# Patient Record
Sex: Male | Born: 1970 | Race: White | Hispanic: No | Marital: Single | Smoking: Former smoker
Health system: Southern US, Community
[De-identification: ages and names within clinical notes are randomized; demographics above are authoritative.]

## PROBLEM LIST (undated history)

## (undated) DIAGNOSIS — G894 Chronic pain syndrome: Secondary | ICD-10-CM

## (undated) DIAGNOSIS — J309 Allergic rhinitis, unspecified: Secondary | ICD-10-CM

## (undated) DIAGNOSIS — G8929 Other chronic pain: Secondary | ICD-10-CM

## (undated) DIAGNOSIS — Z720 Tobacco use: Secondary | ICD-10-CM

## (undated) DIAGNOSIS — R350 Frequency of micturition: Secondary | ICD-10-CM

## (undated) DIAGNOSIS — H1045 Other chronic allergic conjunctivitis: Secondary | ICD-10-CM

## (undated) DIAGNOSIS — R7301 Impaired fasting glucose: Secondary | ICD-10-CM

## (undated) DIAGNOSIS — J4520 Mild intermittent asthma, uncomplicated: Secondary | ICD-10-CM

## (undated) HISTORY — DX: Mild intermittent asthma, uncomplicated: J45.20

## (undated) HISTORY — DX: Frequency of micturition: R35.0

## (undated) HISTORY — PX: ANKLE FRACTURE SURGERY: SHX122

## (undated) HISTORY — PX: WISDOM TOOTH EXTRACTION: SHX21

## (undated) HISTORY — DX: Tobacco use: Z72.0

## (undated) HISTORY — PX: HYPOSPADIAS CORRECTION: SHX483

## (undated) HISTORY — DX: Chronic pain syndrome: G89.4

## (undated) HISTORY — DX: Allergic rhinitis, unspecified: J30.9

## (undated) HISTORY — DX: Impaired fasting glucose: R73.01

## (undated) HISTORY — DX: Other chronic allergic conjunctivitis: H10.45

---

## 2014-04-04 ENCOUNTER — Ambulatory Visit (INDEPENDENT_AMBULATORY_CARE_PROVIDER_SITE_OTHER): Payer: BC Managed Care – PPO | Admitting: Family Medicine

## 2014-04-04 ENCOUNTER — Encounter: Payer: Self-pay | Admitting: Family Medicine

## 2014-04-04 VITALS — BP 127/88 | HR 81 | Temp 98.1°F | Resp 18 | Ht 70.0 in | Wt 180.0 lb

## 2014-04-04 DIAGNOSIS — N411 Chronic prostatitis: Secondary | ICD-10-CM

## 2014-04-04 DIAGNOSIS — Z0000 Encounter for general adult medical examination without abnormal findings: Secondary | ICD-10-CM

## 2014-04-04 DIAGNOSIS — R350 Frequency of micturition: Secondary | ICD-10-CM

## 2014-04-04 LAB — CBC WITH DIFFERENTIAL/PLATELET
Basophils Absolute: 0 10*3/uL (ref 0.0–0.1)
Basophils Relative: 0.4 % (ref 0.0–3.0)
EOS PCT: 0.9 % (ref 0.0–5.0)
Eosinophils Absolute: 0.1 10*3/uL (ref 0.0–0.7)
HCT: 45.7 % (ref 39.0–52.0)
Hemoglobin: 15.2 g/dL (ref 13.0–17.0)
LYMPHS ABS: 2.3 10*3/uL (ref 0.7–4.0)
Lymphocytes Relative: 26.4 % (ref 12.0–46.0)
MCHC: 33.2 g/dL (ref 30.0–36.0)
MCV: 86.7 fl (ref 78.0–100.0)
MONOS PCT: 7.4 % (ref 3.0–12.0)
Monocytes Absolute: 0.6 10*3/uL (ref 0.1–1.0)
NEUTROS PCT: 64.9 % (ref 43.0–77.0)
Neutro Abs: 5.7 10*3/uL (ref 1.4–7.7)
Platelets: 293 10*3/uL (ref 150.0–400.0)
RBC: 5.27 Mil/uL (ref 4.22–5.81)
RDW: 13.4 % (ref 11.5–15.5)
WBC: 8.7 10*3/uL (ref 4.0–10.5)

## 2014-04-04 LAB — POCT URINALYSIS DIPSTICK
Bilirubin, UA: NEGATIVE
Blood, UA: NEGATIVE
Glucose, UA: NEGATIVE
Ketones, UA: NEGATIVE
LEUKOCYTES UA: NEGATIVE
NITRITE UA: NEGATIVE
PH UA: 5
PROTEIN UA: NEGATIVE
Spec Grav, UA: 1.025
UROBILINOGEN UA: 0.2

## 2014-04-04 MED ORDER — CIPROFLOXACIN HCL 500 MG PO TABS: 500.0000 mg | ORAL_TABLET | Freq: Two times a day (BID) | ORAL | Status: DC

## 2014-04-04 NOTE — Progress Notes (Signed)
Pre visit review using our clinic review tool, if applicable. No additional management support is needed unless otherwise documented below in the visit note. 

## 2014-04-04 NOTE — Progress Notes (Addendum)
Office Note 04/04/2014  CC:  Chief Complaint  Patient presents with  . Establish Care  . Urinary Frequency   HPI:  Sean Hale is a 43 y.o. White male who is here to establish care. Patient's most recent primary MD: none Old records were reviewed prior to or during today's visit.  Says he wants to get in better shape and live healthier so wants to get established with PCP, which he has never really had.  [place]    Hx of increased urinary frequency > 10 yrs.  Some increase in dribbling after emptying bladder lately--about once a week.  No dysuria.  Says he pees a lot each time. No nocturia.  No trouble with stream.  Empties bladder well.  Denies hx of prostate discomfort, no hx of prostatitis per his recall.  He says his frequency is even worse if he drinks only water instead of caffeinated drinks.  Says he has never had STD.  Says tested neg in past after a girlfriend cheated on him.  Past Medical History  Diagnosis Date  . Mild intermittent asthma   . Urinary frequency     Past Surgical History  Procedure Laterality Date  . Hypospadias correction    . Wisdom tooth extraction    . Ankle fracture surgery      Right ankle surgery (pins in) 2007    Family History  Problem Relation Age of Onset  . Cancer Mother   . Diabetes Father   . Graves' disease Sister   . Diabetes Brother   . Cancer Maternal Uncle   . Cancer Maternal Grandmother     History   Social History  . Marital Status: Single    Spouse Name: N/A    Number of Children: N/A  . Years of Education: N/A   Occupational History  . Not on file.   Social History Main Topics  . Smoking status: Former Smoker -- 2.00 packs/day for 22 years    Types: Cigarettes    Quit date: 09/02/2012  . Smokeless tobacco: Never Used  . Alcohol Use: No  . Drug Use: No  . Sexual Activity: Not on file   Other Topics Concern  . Not on file   Social History Narrative   Single, no children.    Orig from [place].   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   Tob: 40 pack-yr hx.  Alcohol: none currently.  Drugs: none.          Outpatient Encounter Prescriptions as of 04/04/2014  Medication Sig  . ciprofloxacin (CIPRO) 500 MG tablet Take 1 tablet (500 mg total) by mouth 2 (two) times daily.  Marland Kitchen. dextrose 5 % SOLN 1,000 mL with MVI-12 INJ 10 mL Inject 10 mLs into the vein daily.  . Omega-3 Fatty Acids (FISH OIL) 1000 MG CAPS Take by mouth.    Allergies  Allergen Reactions  . Anectine [Succinylcholine Chloride]     Stops heart.  . Codeine     Insomnia     ROS Review of Systems  Constitutional: Negative for fever and fatigue.  HENT: Negative for congestion and sore throat.   Eyes: Negative for visual disturbance.  Respiratory: Negative for cough.   Cardiovascular: Negative for chest pain.  Gastrointestinal: Negative for nausea and abdominal pain.  Genitourinary: Negative for dysuria.  Musculoskeletal: Negative for back pain and joint swelling.  Skin: Negative for rash.  Neurological: Negative for weakness and headaches.  Hematological: Negative for adenopathy.  PE; Blood pressure 127/88, pulse 81, temperature 98.1 F (36.7 C), temperature source Temporal, resp. rate 18, height 5\' 10"  (1.778 m), weight 180 lb (81.647 kg), SpO2 99.00%. Gen: Alert, well appearing.  Patient is oriented to person, place, time, and situation. AFFECT: pleasant, lucid thought and speech. ZOX:WRUEENT:Eyes: no injection, icteris, swelling, or exudate.  EOMI, PERRLA. Mouth: lips without lesion/swelling.  Oral mucosa pink and moist. Oropharynx without erythema, exudate, or swelling. 2 Neck - No masses or thyromegaly or limitation in range of motion CV: RRR, no m/r/g.   LUNGS: CTA bilat, nonlabored resps, good aeration in all lung fields. EXT: no clubbing, cyanosis, or edema.  Rectal exam: negative without mass, lesions or tenderness,  PROSTATE EXAM: smooth and symmetric without nodules,  enlarged 2+, tenderness noted diffusely, + urge to urinate noted with palpation.  Pertinent labs:  CC UA today was completely normal.  ASSESSMENT AND PLAN:   New pt: no old records to obtain.  Chronic prostatitis Ciprofloxacin 500 mg bid x 3 wks, may extend 2 additional weeks if incomplete improvement felt. If not improved at all, will ask urology to see. Will recheck in 3 wks and do CPE at that time.  Today, since he is fasting, will check CMET, CBC, FLP, TSH in prep for CPE at next f/u.  Return in about 3 weeks (around 04/25/2014) for 30 min for CPE..Marland Kitchen

## 2014-04-04 NOTE — Assessment & Plan Note (Addendum)
Ciprofloxacin 500 mg bid x 3 wks, may extend 2 additional weeks if incomplete improvement felt. If not improved at all, will ask urology to see. Will recheck in 3 wks and do CPE at that time.

## 2014-04-04 NOTE — Addendum Note (Signed)
Addended by: Jeoffrey MassedMCGOWEN, Nikos Anglemyer H on: 04/04/2014 11:39 AM   Modules accepted: Orders

## 2014-04-05 LAB — TSH: TSH: 0.98 u[IU]/mL (ref 0.35–4.50)

## 2014-04-05 LAB — COMPREHENSIVE METABOLIC PANEL
ALT: 15 U/L (ref 0–53)
AST: 15 U/L (ref 0–37)
Albumin: 3.6 g/dL (ref 3.5–5.2)
Alkaline Phosphatase: 84 U/L (ref 39–117)
BILIRUBIN TOTAL: 0.7 mg/dL (ref 0.2–1.2)
BUN: 16 mg/dL (ref 6–23)
CALCIUM: 9.6 mg/dL (ref 8.4–10.5)
CHLORIDE: 101 meq/L (ref 96–112)
CO2: 28 mEq/L (ref 19–32)
CREATININE: 1 mg/dL (ref 0.4–1.5)
GFR: 88.43 mL/min (ref >60.00)
Glucose, Bld: 101 mg/dL — ABNORMAL HIGH (ref 70–99)
Potassium: 4.7 mEq/L (ref 3.5–5.1)
SODIUM: 139 meq/L (ref 135–145)
TOTAL PROTEIN: 7.9 g/dL (ref 6.0–8.3)

## 2014-04-05 LAB — LIPID PANEL
CHOLESTEROL: 181 mg/dL (ref 0–200)
HDL: 42.5 mg/dL (ref >39.00)
LDL Cholesterol: 113 mg/dL — ABNORMAL HIGH (ref 0–99)
NONHDL: 138.5
Total CHOL/HDL Ratio: 4
Triglycerides: 130 mg/dL (ref 0.0–149.0)
VLDL: 26 mg/dL (ref 0.0–40.0)

## 2014-04-26 ENCOUNTER — Encounter: Payer: Self-pay | Admitting: Family Medicine

## 2014-04-26 ENCOUNTER — Ambulatory Visit (INDEPENDENT_AMBULATORY_CARE_PROVIDER_SITE_OTHER): Payer: BC Managed Care – PPO | Admitting: Family Medicine

## 2014-04-26 VITALS — BP 127/89 | HR 76 | Temp 98.9°F | Resp 18 | Ht 70.0 in | Wt 190.0 lb

## 2014-04-26 DIAGNOSIS — H9190 Unspecified hearing loss, unspecified ear: Secondary | ICD-10-CM | POA: Insufficient documentation

## 2014-04-26 DIAGNOSIS — M25571 Pain in right ankle and joints of right foot: Secondary | ICD-10-CM

## 2014-04-26 DIAGNOSIS — H9192 Unspecified hearing loss, left ear: Secondary | ICD-10-CM

## 2014-04-26 DIAGNOSIS — N411 Chronic prostatitis: Secondary | ICD-10-CM

## 2014-04-26 DIAGNOSIS — Z0000 Encounter for general adult medical examination without abnormal findings: Secondary | ICD-10-CM | POA: Insufficient documentation

## 2014-04-26 DIAGNOSIS — Z91030 Bee allergy status: Secondary | ICD-10-CM

## 2014-04-26 DIAGNOSIS — G8929 Other chronic pain: Secondary | ICD-10-CM | POA: Insufficient documentation

## 2014-04-26 DIAGNOSIS — Z91013 Allergy to seafood: Secondary | ICD-10-CM

## 2014-04-26 MED ORDER — CIPROFLOXACIN HCL 500 MG PO TABS: 500.0000 mg | ORAL_TABLET | Freq: Two times a day (BID) | ORAL | Status: DC

## 2014-04-26 MED ORDER — HYDROCODONE-ACETAMINOPHEN 7.5-300 MG PO TABS: ORAL_TABLET | ORAL | Status: DC

## 2014-04-26 NOTE — Assessment & Plan Note (Signed)
Improved with 3 wks of abx. Will give 3 more weeks of cipro 500 mg bid and see him back for recheck. If not completely normal urinary habits at that time then he wishes to see a urologist.

## 2014-04-26 NOTE — Progress Notes (Signed)
Pre visit review using our clinic review tool, if applicable. No additional management support is needed unless otherwise documented below in the visit note. 

## 2014-04-26 NOTE — Assessment & Plan Note (Signed)
Subjective. [facility] after right sided cerumen impaction removed today. We did not test hearing today, chose to wait and see how he feels over the next few weeks--recheck in office 3 wks.

## 2014-04-26 NOTE — Progress Notes (Signed)
Office Note 04/26/2014  CC:  Chief Complaint  Patient presents with  . Annual Exam   HPI:  Sean Hale is a 43 y.o. White male who is here for CPE and f/u recent recurrent/chronic prostatitis. Rx'd cipro x 3 wks.  He feels about 50 % improved regarding his urinary frequency and urgency. Has hx of pain in winter in right ankle when he works out--occ takes a vicodin in past (approx 2 per month) since hx of fracture/surgery on that ankle.     Past Medical History  Diagnosis Date  . Mild intermittent asthma   . Urinary frequency   . Health maintenance examination 04/26/2014    Past Surgical History  Procedure Laterality Date  . Hypospadias correction    . Wisdom tooth extraction    . Ankle fracture surgery      Right ankle surgery (pins in) 2007    Family History  Problem Relation Age of Onset  . Cancer Mother   . Diabetes Father   . Graves' disease Sister   . Diabetes Brother   . Cancer Maternal Uncle   . Cancer Maternal Grandmother     History   Social History  . Marital Status: Single    Spouse Name: N/A    Number of Children: N/A  . Years of Education: N/A   Occupational History  . Not on file.   Social History Main Topics  . Smoking status: Former Smoker -- 2.00 packs/day for 22 years    Types: Cigarettes    Quit date: 09/02/2012  . Smokeless tobacco: Never Used  . Alcohol Use: No  . Drug Use: No  . Sexual Activity: Not on file   Other Topics Concern  . Not on file   Social History Narrative   Single, no children.   Orig from [place].   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   Tob: 40 pack-yr hx.  Alcohol: none currently.  Drugs: none.          Outpatient Prescriptions Prior to Visit  Medication Sig Dispense Refill  . Omega-3 Fatty Acids (FISH OIL) 1000 MG CAPS Take by mouth.    . ciprofloxacin (CIPRO) 500 MG tablet Take 1 tablet (500 mg total) by mouth 2 (two) times daily. 42 tablet 0  . dextrose 5 % SOLN 1,000 mL with MVI-12 INJ  10 mL Inject 10 mLs into the vein daily.     No facility-administered medications prior to visit.    Allergies  Allergen Reactions  . Anectine [Succinylcholine Chloride]     Stops heart.  . Codeine     Insomnia     ROS Review of Systems  Constitutional: Negative for fever, chills, appetite change and fatigue.  HENT: Positive for hearing loss ("can't hear as well in left ear for about the last 9-10 mo, occ centrally located humming noise"). Negative for congestion, dental problem, ear pain and sore throat.   Eyes: Negative for discharge, redness and visual disturbance.  Respiratory: Negative for cough, chest tightness, shortness of breath and wheezing.   Cardiovascular: Negative for chest pain, palpitations and leg swelling.  Gastrointestinal: Negative for nausea, vomiting, abdominal pain, diarrhea and blood in stool.  Genitourinary: Negative for dysuria, urgency, frequency, hematuria, flank pain and difficulty urinating.  Musculoskeletal: Negative for myalgias, back pain, joint swelling, arthralgias and neck stiffness.  Skin: Negative for pallor and rash.  Neurological: Negative for dizziness, speech difficulty, weakness and headaches.  Hematological: Negative for adenopathy. Does not bruise/bleed easily.  Psychiatric/Behavioral:  Negative for confusion and sleep disturbance. The patient is not nervous/anxious.     PE; Blood pressure 127/89, pulse 76, temperature 98.9 F (37.2 C), temperature source Temporal, resp. rate 18, height 5\' 10"  (1.778 m), weight 190 lb (86.183 kg), SpO2 98 %. Gen: Alert, well appearing.  Patient is oriented to person, place, time, and situation. AFFECT: pleasant, lucid thought and speech. ENT: Ears: EACs: left clear, normal epithelium.  TM with good light reflex and landmarks bilaterally.  Right EAC had 80% cerumen impaction and when I removed this with my curette he had resolution of his hearing complaint on the OPPOSITE side.   Eyes: no injection,  icteris, swelling, or exudate.  EOMI, PERRLA. Nose: no drainage or turbinate edema/swelling.  No injection or focal lesion.  Mouth: lips without lesion/swelling.  Oral mucosa pink and moist.  Dentition intact and without obvious caries or gingival swelling.  Oropharynx without erythema, exudate, or swelling.  Neck: supple/nontender.  No LAD, mass, or TM.  Carotid pulses 2+ bilaterally, without bruits. CV: RRR, no m/r/g.   LUNGS: CTA bilat, nonlabored resps, good aeration in all lung fields. ABD: soft, NT, ND, BS normal.  No hepatospenomegaly or mass.  No bruits. EXT: no clubbing, cyanosis, or edema.  Musculoskeletal: no joint swelling, erythema, warmth, or tenderness.  ROM of all joints intact. Skin - no sores or suspicious lesions or rashes or color changes Rectal exam: negative without mass, lesions or tenderness, PROSTATE EXAM: smooth and symmetric without nodules but very mildly tender diffusely and very mild urge to urinate with palpation.  Pertinent labs:  Lab Results  Component Value Date   TSH 0.98 04/04/2014   Lab Results  Component Value Date   WBC 8.7 04/04/2014   HGB 15.2 04/04/2014   HCT 45.7 04/04/2014   MCV 86.7 04/04/2014   PLT 293.0 04/04/2014   Lab Results  Component Value Date   CREATININE 1.0 04/04/2014   BUN 16 04/04/2014   NA 139 04/04/2014   K 4.7 04/04/2014   CL 101 04/04/2014   CO2 28 04/04/2014   Lab Results  Component Value Date   ALT 15 04/04/2014   AST 15 04/04/2014   ALKPHOS 84 04/04/2014   BILITOT 0.7 04/04/2014   Lab Results  Component Value Date   CHOL 181 04/04/2014   Lab Results  Component Value Date   HDL 42.50 04/04/2014   Lab Results  Component Value Date   LDLCALC 113* 04/04/2014   Lab Results  Component Value Date   TRIG 130.0 04/04/2014   Lab Results  Component Value Date   CHOLHDL 4 04/04/2014   No results found for: PSA   ASSESSMENT AND PLAN:   Health maintenance examination Reviewed age and gender  appropriate health maintenance issues (prudent diet, regular exercise, health risks of tobacco and excessive alcohol, use of seatbelts, fire alarms in home, use of sunscreen).  Also reviewed age and gender appropriate health screening as well as vaccine recommendations. HP labs normal, reviewed with pt. He has hx of allergy to bee sting as child and to shellfish; he requests repeat allergy testing so I have ordered referral to allergiest today.   Chronic prostatitis Improved with 3 wks of abx. Will give 3 more weeks of cipro 500 mg bid and see him back for recheck. If not completely normal urinary habits at that time then he wishes to see a urologist.  Hearing impairment Subjective. He seemed to feel [facility] improved after right sided cerumen impaction removed today. We  did not test hearing today, chose to wait and see how he feels over the next few weeks--recheck in office 3 wks.  Chronic pain of right ankle Hurts only in winter time with/after working out. Says he has hx of using approx 2 vicodin 7.5mg  tabs per month, so I agreed to rx #30 of these today and this should last him until summer 2016 or so.  An After Visit Summary was printed and given to the patient.  FOLLOW UP:  Return in about 3 weeks (around 05/17/2014) for f/u prostatitis and hearing complaint.

## 2014-04-26 NOTE — Assessment & Plan Note (Signed)
Reviewed age and gender appropriate health maintenance issues (prudent diet, regular exercise, health risks of tobacco and excessive alcohol, use of seatbelts, fire alarms in home, use of sunscreen).  Also reviewed age and gender appropriate health screening as well as vaccine recommendations. HP labs normal, reviewed with pt. He has hx of allergy to bee sting as child and to shellfish; he requests repeat allergy testing so I have ordered referral to allergiest today.

## 2014-04-26 NOTE — Assessment & Plan Note (Signed)
Hurts only in winter time with/after working out. Says he has hx of using approx 2 vicodin 7.5mg  tabs per month, so I agreed to rx #30 of these today and this should last him until summer 2016 or so.

## 2014-05-22 ENCOUNTER — Encounter: Payer: Self-pay | Admitting: Family Medicine

## 2014-05-23 ENCOUNTER — Ambulatory Visit: Payer: BC Managed Care – PPO | Admitting: Family Medicine

## 2014-09-24 ENCOUNTER — Ambulatory Visit (INDEPENDENT_AMBULATORY_CARE_PROVIDER_SITE_OTHER): Payer: BLUE CROSS/BLUE SHIELD | Admitting: Family Medicine

## 2014-09-24 ENCOUNTER — Encounter: Payer: Self-pay | Admitting: *Deleted

## 2014-09-24 ENCOUNTER — Encounter: Payer: Self-pay | Admitting: Family Medicine

## 2014-09-24 VITALS — BP 118/78 | HR 78 | Temp 97.7°F | Ht 70.0 in | Wt 194.0 lb

## 2014-09-24 DIAGNOSIS — R509 Fever, unspecified: Secondary | ICD-10-CM | POA: Diagnosis not present

## 2014-09-24 DIAGNOSIS — M25571 Pain in right ankle and joints of right foot: Secondary | ICD-10-CM | POA: Diagnosis not present

## 2014-09-24 DIAGNOSIS — G8929 Other chronic pain: Secondary | ICD-10-CM

## 2014-09-24 DIAGNOSIS — R1033 Periumbilical pain: Secondary | ICD-10-CM

## 2014-09-24 DIAGNOSIS — R109 Unspecified abdominal pain: Secondary | ICD-10-CM | POA: Insufficient documentation

## 2014-09-24 MED ORDER — HYDROCODONE-ACETAMINOPHEN 7.5-300 MG PO TABS: ORAL_TABLET | ORAL | Status: DC

## 2014-09-24 NOTE — Progress Notes (Signed)
Pre visit review using our clinic review tool, if applicable. No additional management support is needed unless otherwise documented below in the visit note. 

## 2014-09-24 NOTE — Progress Notes (Signed)
OFFICE NOTE  09/24/2014  CC:  Chief Complaint  Patient presents with  . Follow-up    medication refills    HPI: Patient is a 44 y.o. Caucasian male who is here for f/u, needs rf of his vicodin 7.5/300 tabs that he takes occasionally for intermittent R ankle pain since surgery 2007.  Also, describes a recent 4d period of abdominal pain with fever, no n/v.  Says his throat was itchy and hurt some.  No diarrhea or constipation.  No cough.  Missed 4 days of work and asks for work excuse. Says it may have had something to do with some pizza he ate.  No urinary urgency, frequency, hematuria, or prostate discomfort.  No melena or hematochezia.  No rash.  No recent travel or new meds. Says all sx's have been resolved for last 1-2 d now.  Also got some teeth pulled recently and had to use some of his vicodin for this.   Has not had any significant issues with his prostate since I saw him last.  Pertinent PMH:  Past medical, surgical, social, and family history reviewed and no changes are noted since last office visit.  MEDS: Not taking cipro as listed below Outpatient Prescriptions Prior to Visit  Medication Sig Dispense Refill  . Hydrocodone-Acetaminophen 7.5-300 MG TABS 1-2 tabs q6h prn right ankle pain 30 each 0  . Multiple Vitamin (MULTIVITAMIN) tablet Take 1 tablet by mouth daily.    . Omega-3 Fatty Acids (FISH OIL) 1000 MG CAPS Take by mouth.    . ciprofloxacin (CIPRO) 500 MG tablet Take 1 tablet (500 mg total) by mouth 2 (two) times daily. (Patient not taking: Reported on 09/24/2014) 42 tablet 0   No facility-administered medications prior to visit.    PE: Blood pressure 118/78, pulse 78, temperature 97.7 F (36.5 C), temperature source Oral, height 5\' 10"  (1.778 m), weight 194 lb (87.998 kg), SpO2 97 %. Gen: Alert, well appearing.  Patient is oriented to person, place, time, and situation. CV: RRR, no m/r/g.   LUNGS: CTA bilat, nonlabored resps, good aeration in all lung  fields. ABD: mild discomfort with palpation over umbillical region EXT: no clubbing, cyanosis, or edema.  ROM of R ankle intact.  Mild discomfort with ROM but nontender and no swelling or erythema.   IMPRESSION AND PLAN:  1) Chronic, intermittent R ankle pain.  Uses vicodin 7.5/300 for this occasionally. Printed new rx for this today.  Therapeutic expectations and side effect profile of medication discussed today.  Patient's questions answered.  2) Recent febrile illness with abd pain: seems to have resolved spontaneously.   Question of food poisoning but seems a bit lengthy for this type of illness. At any rate, he appears to be well now.  Work excuse written f7or the 4 days he missed due to this illness.  An After Visit Summary was printed and given to the patient.  FOLLOW UP:  7-8 mo for fasting CPE

## 2015-01-04 ENCOUNTER — Other Ambulatory Visit: Payer: Self-pay | Admitting: *Deleted

## 2015-01-04 MED ORDER — HYDROCODONE-ACETAMINOPHEN 7.5-300 MG PO TABS: ORAL_TABLET | ORAL | Status: DC

## 2015-01-04 NOTE — Telephone Encounter (Signed)
Pt LMOM RF request for hydro/APAP LOV: 09/24/14 Next ov: None Last written: 09/24/14 #30 w/ [id number]

## 2015-03-22 ENCOUNTER — Encounter: Payer: Self-pay | Admitting: Family Medicine

## 2015-03-22 ENCOUNTER — Ambulatory Visit (INDEPENDENT_AMBULATORY_CARE_PROVIDER_SITE_OTHER): Payer: BLUE CROSS/BLUE SHIELD | Admitting: Family Medicine

## 2015-03-22 VITALS — BP 116/85 | HR 80 | Temp 98.5°F | Resp 16 | Ht 70.0 in | Wt 193.0 lb

## 2015-03-22 DIAGNOSIS — S29011A Strain of muscle and tendon of front wall of thorax, initial encounter: Secondary | ICD-10-CM

## 2015-03-22 DIAGNOSIS — S2341XA Sprain of ribs, initial encounter: Secondary | ICD-10-CM | POA: Diagnosis not present

## 2015-03-22 MED ORDER — HYDROCODONE-ACETAMINOPHEN 7.5-300 MG PO TABS: ORAL_TABLET | ORAL | Status: DC

## 2015-03-22 NOTE — Progress Notes (Signed)
Pre visit review using our clinic review tool, if applicable. No additional management support is needed unless otherwise documented below in the visit note. 

## 2015-03-22 NOTE — Patient Instructions (Signed)
Take 600 mg generic OTC ibuprofen twice a day WITH FOOD for 10 days, then stop.

## 2015-03-22 NOTE — Progress Notes (Signed)
OFFICE NOTE  03/22/2015  CC:  Chief Complaint  Patient presents with  . Chest Pain    ribs on left side x 1 week    HPI: Patient is a 44 y.o. Caucasian male who is here for sharp, left sided chest pain, onset about 5 days ago. Progressively getting worse.  Only hurts when he takes a deep breath, laughs, or moves trunk certain ways.  Has started working out lately again, also moved furniture relatively recently.  No SOB, no cough, no fever.  Took ibup 2 tabs x 2 doses--no help so he stopped it.  Also tried vicodin.  Pertinent PMH:  Past Medical History  Diagnosis Date  . Mild intermittent asthma   . Urinary frequency   . Health maintenance examination 04/26/2014  . Allergic rhinitis     pollen (+other?)  . Chronic allergic conjunctivitis    Past Surgical History  Procedure Laterality Date  . Hypospadias correction    . Wisdom tooth extraction    . Ankle fracture surgery      Right ankle surgery (pins in) 2007    MEDS:  Outpatient Prescriptions Prior to Visit  Medication Sig Dispense Refill  . Hydrocodone-Acetaminophen 7.5-300 MG TABS 1-2 tabs q6h prn right ankle pain 30 each 0  . Multiple Vitamin (MULTIVITAMIN) tablet Take 1 tablet by mouth daily.    . Omega-3 Fatty Acids (FISH OIL) 1000 MG CAPS Take by mouth.     No facility-administered medications prior to visit.    PE: Blood pressure 116/85, pulse 80, temperature 98.5 F (36.9 C), temperature source Oral, resp. rate 16, height  (1.778 m), weight 193 lb (87.544 kg), SpO2 98 %. Gen: Alert, well appearing.  Patient is oriented to person, place, time, and situation. ZOX:WRUE: no injection, icteris, swelling, or exudate.  EOMI, PERRLA. Mouth: lips without lesion/swelling.  Oral mucosa pink and moist. Oropharynx without erythema, exudate, or swelling.  Neck - No masses or thyromegaly or limitation in range of motion CV: RRR, no m/r/g.   LUNGS: CTA bilat, nonlabored resps, good aeration in all lung fields. Chest  wall: no sternal or costochondral TTP.  He has focal intercostal TTP in region just below L nipple (in 2 rib spaces).  No rib tenderness or deformity.  No rash or bruising.  IMPRESSION AND PLAN:  Intercostal muscle strain: ibuprofen 600 mg bid with food x 10d. Ice or heat--whichever he finds most helpful for comfort. Rest/avoid lifting, twisting, bending, or other offending movements the best he can over the next 7-10 days.  I RF'd his vicodin rx today: #30.  An After Visit Summary was printed and given to the patient.   FOLLOW UP: prn

## 2015-05-13 ENCOUNTER — Other Ambulatory Visit: Payer: Self-pay | Admitting: *Deleted

## 2015-05-13 NOTE — Telephone Encounter (Signed)
RF request for hydrocodone LOV: 03/22/15 Next ov: None Last written: 03/22/15 #30 w/ 0RF  Please advise. Thanks.

## 2015-05-15 MED ORDER — HYDROCODONE-ACETAMINOPHEN 7.5-300 MG PO TABS: ORAL_TABLET | ORAL | Status: DC

## 2015-05-15 NOTE — Telephone Encounter (Signed)
Left message stating Rx is ready for p/u.  

## 2015-05-15 NOTE — Telephone Encounter (Signed)
I have printed a rx for vicodin #30 but notify pt that he has to be seen in office for f/u of his pain prior to any FURTHER Rx's for this med.-thx

## 2015-07-05 ENCOUNTER — Other Ambulatory Visit: Payer: Self-pay | Admitting: *Deleted

## 2015-07-05 NOTE — Telephone Encounter (Signed)
Pt LMOM on 07/05/15 at 9:46am requesting refill.   RF request for hydrocodone/apap LOV: 03/22/15 Next ov: None Last written: 05/15/15 #30 w/ 0RF  Please advise. Thanks.

## 2015-07-08 MED ORDER — HYDROCODONE-ACETAMINOPHEN 7.5-300 MG PO TABS: ORAL_TABLET | ORAL | Status: DC

## 2015-07-08 NOTE — Telephone Encounter (Signed)
Will print rx but pt needs office f/u: either med check 15 min or CPE 30 min--prior to any FURTHER RF's of this med.-thx

## 2015-07-10 NOTE — Telephone Encounter (Signed)
Left message for pt to call back  °

## 2015-07-12 NOTE — Telephone Encounter (Addendum)
Left message for pt to call back. Pt has already p/u Rx.

## 2015-07-15 NOTE — Telephone Encounter (Signed)
Left detailed message on cell vm, okay per DPR.  

## 2015-07-16 ENCOUNTER — Encounter: Payer: Self-pay | Admitting: Family Medicine

## 2015-07-16 ENCOUNTER — Encounter: Payer: Self-pay | Admitting: *Deleted

## 2015-07-16 ENCOUNTER — Ambulatory Visit (INDEPENDENT_AMBULATORY_CARE_PROVIDER_SITE_OTHER): Payer: Managed Care, Other (non HMO) | Admitting: Family Medicine

## 2015-07-16 VITALS — BP 119/85 | HR 74 | Temp 97.9°F | Resp 16 | Ht 70.0 in | Wt 195.5 lb

## 2015-07-16 DIAGNOSIS — J111 Influenza due to unidentified influenza virus with other respiratory manifestations: Secondary | ICD-10-CM | POA: Diagnosis not present

## 2015-07-16 DIAGNOSIS — J018 Other acute sinusitis: Secondary | ICD-10-CM

## 2015-07-16 DIAGNOSIS — R69 Illness, unspecified: Secondary | ICD-10-CM

## 2015-07-16 MED ORDER — CLARITHROMYCIN 500 MG PO TABS: 500.0000 mg | ORAL_TABLET | Freq: Two times a day (BID) | ORAL | Status: DC

## 2015-07-16 NOTE — Progress Notes (Signed)
OFFICE VISIT  07/16/2015   CC:  Chief Complaint  Patient presents with  . Cough    productive with low grade fever and fatigue x 1 week    HPI:    Patient is a 45 y.o. Caucasian male who presents for almost 2 week history of respiratory illness. Started with ST, HA, subjective fevers--low grade, mild nausea, body achy all over.  He got up one morning and coughed up some phlegm but had no further symptoms like this.   No SOB.  Has felt very fatigued.  No rash.   Had been having nasal/sinus congestion/runny nose for 2 weeks PRIOR to onset of this illness. No face pain, but has been having L>R upper teeth pain.  +Facial pressure/peri-orbital HA.  Past Medical History  Diagnosis Date  . Mild intermittent asthma   . Urinary frequency   . Health maintenance examination 04/26/2014  . Allergic rhinitis     pollen (+other?)  . Chronic allergic conjunctivitis     Past Surgical History  Procedure Laterality Date  . Hypospadias correction    . Wisdom tooth extraction    . Ankle fracture surgery      Right ankle surgery (pins in) 2007    Outpatient Prescriptions Prior to Visit  Medication Sig Dispense Refill  . Hydrocodone-Acetaminophen 7.5-300 MG TABS 1-2 tabs q6h prn right ankle pain 30 each 0  . Multiple Vitamin (MULTIVITAMIN) tablet Take 1 tablet by mouth daily.    . Omega-3 Fatty Acids (FISH OIL) 1000 MG CAPS Take by mouth.     No facility-administered medications prior to visit.    Allergies  Allergen Reactions  . Anectine [Succinylcholine Chloride]     Stops heart.  . Codeine     Insomnia     ROS As per HPI  PE: Blood pressure 119/85, pulse 74, temperature 97.9 F (36.6 C), temperature source Oral, resp. rate 16, height  (1.778 m), weight 195 lb 8 oz (88.678 kg), SpO2 94 %. VS: noted--normal. Gen: alert, NAD, NONTOXIC APPEARING. HEENT: eyes without injection, drainage, or swelling.  Ears: EACs clear, TMs with normal light reflex and landmarks.  Nose: Clear  rhinorrhea, with some dried, crusty exudate adherent to mildly injected mucosa.  No purulent d/c.  No paranasal sinus TTP.  No facial swelling.  Throat and mouth without focal lesion.  No pharyngial swelling, erythema, or exudate.   Neck: supple, no LAD.   LUNGS: CTA bilat, nonlabored resps.   CV: RRR, no m/r/g. EXT: no c/c/e SKIN: no rash  LABS:  None today  IMPRESSION AND PLAN:  Recent influenza-like illness. I believe he has an acute sinus infection now. Clarithromycin 500 mg bid x 10d. He says nasal sprays give him a HA. Note for work written--he missed 1/15-1/19 (5 days), and he returned to work 07/14/15.  An After Visit Summary was printed and given to the patient.  FOLLOW UP: Return if symptoms worsen or fail to improve.

## 2015-07-16 NOTE — Progress Notes (Signed)
Pre visit review using our clinic review tool, if applicable. No additional management support is needed unless otherwise documented below in the visit note. 

## 2015-07-26 ENCOUNTER — Encounter: Payer: Self-pay | Admitting: Family Medicine

## 2015-07-26 ENCOUNTER — Ambulatory Visit (INDEPENDENT_AMBULATORY_CARE_PROVIDER_SITE_OTHER): Payer: Managed Care, Other (non HMO) | Admitting: Family Medicine

## 2015-07-26 VITALS — BP 125/88 | HR 80 | Temp 97.5°F | Resp 16 | Ht 70.0 in | Wt 199.0 lb

## 2015-07-26 DIAGNOSIS — M25571 Pain in right ankle and joints of right foot: Secondary | ICD-10-CM

## 2015-07-26 DIAGNOSIS — G894 Chronic pain syndrome: Secondary | ICD-10-CM | POA: Diagnosis not present

## 2015-07-26 MED ORDER — HYDROCODONE-ACETAMINOPHEN 7.5-300 MG PO TABS: ORAL_TABLET | ORAL | Status: DC

## 2015-07-26 NOTE — Progress Notes (Signed)
OFFICE VISIT  07/26/2015   CC:  Chief Complaint  Patient presents with  . Follow-up    Pain medication.     HPI:    Patient is a 45 y.o. Caucasian male who presents for f/u, needs RF of his vicodin 7.5/300 tabs that he takes occasionally for intermittent R ankle pain since surgery 2007.   Recently strained L thigh when bowling and his left foot slipped.   R ankle hurts intermittently, worse with cold weather and when he intermittently twists it--it is susceptible to twisting easily since having surgery.  He takes anywhere from 2-7 vicodin pills per week.  Most recent one he took was this morning.  He also wears soft R ankle support.   Past Medical History  Diagnosis Date  . Mild intermittent asthma   . Urinary frequency   . Health maintenance examination 04/26/2014  . Allergic rhinitis     pollen (+other?)  . Chronic allergic conjunctivitis   . Chronic pain syndrome     R ankle, intermittent pain since surgery 2007    Past Surgical History  Procedure Laterality Date  . Hypospadias correction    . Wisdom tooth extraction    . Ankle fracture surgery      Right ankle surgery (pins in) 2007    Outpatient Prescriptions Prior to Visit  Medication Sig Dispense Refill  . Multiple Vitamin (MULTIVITAMIN) tablet Take 1 tablet by mouth daily.    . Omega-3 Fatty Acids (FISH OIL) 1000 MG CAPS Take by mouth.    . Hydrocodone-Acetaminophen 7.5-300 MG TABS 1-2 tabs q6h prn right ankle pain 30 each 0  . clarithromycin (BIAXIN) 500 MG tablet Take 1 tablet (500 mg total) by mouth 2 (two) times daily. (Patient not taking: Reported on 07/26/2015) 20 tablet 0   No facility-administered medications prior to visit.    Allergies  Allergen Reactions  . Anectine [Succinylcholine Chloride]     Stops heart.  . Codeine     Insomnia     ROS As per HPI  PE: Blood pressure 125/88, pulse 80, temperature 97.5 F (36.4 C), temperature source Oral, resp. rate 16, height  (1.778 m), weight  199 lb (90.266 kg), SpO2 97 %. Gen: Alert, well appearing.  Patient is oriented to person, place, time, and situation. R ankle: no erythema, warmth, or swelling.  PT/DP pulses 2+. ROM intact but some pain with extreme of dorsiflexion.  No tenderness to palpation, no instability.  LABS:  None today  IMPRESSION AND PLAN:  Chronic pain syndrome: R ankle instability/recurrent sprains/arthritic pain--since injury and subsequent surgery 2007.  He uses small amounts of narcotic pain meds.  In the past, use of low/avg dose NSAIDs did nothing for his pain. UDS today (last took a vicodin this morning). I printed rx for vicodin 7.5/300, 1-2 tabs q6h prn, #30  today.    An After Visit Summary was printed and given to the patient.  FOLLOW UP: Return for 30 min CPE appt at pt's convenience.

## 2015-07-26 NOTE — Progress Notes (Signed)
Pre visit review using our clinic review tool, if applicable. No additional management support is needed unless otherwise documented below in the visit note. 

## 2015-08-09 ENCOUNTER — Encounter: Payer: Managed Care, Other (non HMO) | Admitting: Family Medicine

## 2015-08-28 ENCOUNTER — Other Ambulatory Visit: Payer: Self-pay

## 2015-08-28 MED ORDER — HYDROCODONE-ACETAMINOPHEN 7.5-300 MG PO TABS: ORAL_TABLET | ORAL | Status: DC

## 2015-08-28 NOTE — Telephone Encounter (Signed)
Patient requesting refill for: Hydrocodone-Acetaminophen 7.5-300 MG TABS  Last Refill: 07/26/2015 Last OV: 07/26/2015

## 2015-08-28 NOTE — Telephone Encounter (Signed)
This encounter was created in error - please disregard.

## 2015-08-29 ENCOUNTER — Telehealth: Payer: Self-pay

## 2015-08-29 NOTE — Telephone Encounter (Signed)
Called patient left detailed message informing Rx ready for pick up at front desk.

## 2015-09-15 ENCOUNTER — Encounter (HOSPITAL_COMMUNITY): Payer: Self-pay | Admitting: *Deleted

## 2015-09-15 ENCOUNTER — Emergency Department (HOSPITAL_COMMUNITY): Payer: Managed Care, Other (non HMO)

## 2015-09-15 ENCOUNTER — Emergency Department (HOSPITAL_COMMUNITY)
Admission: EM | Admit: 2015-09-15 | Discharge: 2015-09-15 | Disposition: A | Discharge status: Home / Self Care | Payer: Managed Care, Other (non HMO) | Attending: Emergency Medicine | Admitting: Emergency Medicine

## 2015-09-15 DIAGNOSIS — Y9301 Activity, walking, marching and hiking: Secondary | ICD-10-CM | POA: Diagnosis not present

## 2015-09-15 DIAGNOSIS — Z87891 Personal history of nicotine dependence: Secondary | ICD-10-CM | POA: Diagnosis not present

## 2015-09-15 DIAGNOSIS — S99912A Unspecified injury of left ankle, initial encounter: Secondary | ICD-10-CM | POA: Diagnosis not present

## 2015-09-15 DIAGNOSIS — Y92009 Unspecified place in unspecified non-institutional (private) residence as the place of occurrence of the external cause: Secondary | ICD-10-CM | POA: Insufficient documentation

## 2015-09-15 DIAGNOSIS — Y998 Other external cause status: Secondary | ICD-10-CM | POA: Diagnosis not present

## 2015-09-15 DIAGNOSIS — X58XXXA Exposure to other specified factors, initial encounter: Secondary | ICD-10-CM | POA: Insufficient documentation

## 2015-09-15 DIAGNOSIS — J4520 Mild intermittent asthma, uncomplicated: Secondary | ICD-10-CM | POA: Diagnosis not present

## 2015-09-15 DIAGNOSIS — G894 Chronic pain syndrome: Secondary | ICD-10-CM | POA: Insufficient documentation

## 2015-09-15 DIAGNOSIS — Z79899 Other long term (current) drug therapy: Secondary | ICD-10-CM | POA: Diagnosis not present

## 2015-09-15 DIAGNOSIS — M25572 Pain in left ankle and joints of left foot: Secondary | ICD-10-CM

## 2015-09-15 DIAGNOSIS — G8929 Other chronic pain: Secondary | ICD-10-CM | POA: Insufficient documentation

## 2015-09-15 HISTORY — DX: Other chronic pain: G89.29

## 2015-09-15 MED ORDER — KETOROLAC TROMETHAMINE 60 MG/2ML IM SOLN
60.0000 mg | Freq: Once | INTRAMUSCULAR | Status: AC
  Administered 2015-09-15: 60 mg via INTRAMUSCULAR
  Filled 2015-09-15: qty 2

## 2015-09-15 MED ORDER — NAPROXEN 500 MG PO TABS: 500.0000 mg | ORAL_TABLET | Freq: Two times a day (BID) | ORAL | Status: AC

## 2015-09-15 NOTE — ED Provider Notes (Signed)
CSN: [id number]     Arrival date & time 09/15/15  0704 History   First MD Initiated Contact with Patient 09/15/15 [phone]     Chief Complaint  Patient presents with  . Ankle Pain     (Consider location/radiation/quality/duration/timing/severity/associated sxs/prior Treatment) HPI   Sean Hale is a 45 y.o. male, with a history of chronic pain syndrome, presenting to the ED with left foot and ankle pain due to an injury that occurred a week ago. Patient rates his pain at 6 out of 10, throbbing, located laterally on the foot and ankle, nonradiating. Pt states he was walking at home and "rolled" his ankle. Patient states that he has 7.5-300mg  hydrocodone acetaminophen that he uses for chronic pain in his right ankle. Patient took a dose of this medication prior to arrival.     Past Medical History  Diagnosis Date  . Mild intermittent asthma   . Urinary frequency   . Health maintenance examination 04/26/2014  . Allergic rhinitis     pollen (+other?)  . Chronic allergic conjunctivitis   . Chronic pain syndrome     R ankle, intermittent pain since surgery 2007  . Chronic pain     PT reports he takes hydrocodone for chronic pain to RT ankle.   Past Surgical History  Procedure Laterality Date  . Hypospadias correction    . Wisdom tooth extraction    . Ankle fracture surgery      Right ankle surgery (pins in) 2007   Family History  Problem Relation Age of Onset  . Cancer Mother   . Diabetes Father   . Graves' disease Sister   . Diabetes Brother   . Cancer Maternal Uncle   . Cancer Maternal Grandmother    Social History  Substance Use Topics  . Smoking status: Former Smoker -- 2.00 packs/day for 22 years    Types: Cigarettes    Quit date: 09/02/2012  . Smokeless tobacco: Never Used  . Alcohol Use: No    Review of Systems  Musculoskeletal: Positive for arthralgias (left ankle).  Neurological: Negative for weakness and numbness.      Allergies  Anectine and  Codeine  Home Medications   Prior to Admission medications   Medication Sig Start Date End Date Taking? Authorizing Provider  Hydrocodone-Acetaminophen 7.5-300 MG TABS 1-2 tabs q6h prn right ankle pain 08/28/15   Jeoffrey MassedPhilip H McGowen, MD  Multiple Vitamin (MULTIVITAMIN) tablet Take 1 tablet by mouth daily.    Historical Provider, MD  naproxen (NAPROSYN) 500 MG tablet Take 1 tablet (500 mg total) by mouth 2 (two) times daily. 09/15/15   Lunabelle Oatley C Yatziri Wainwright, PA-C  Omega-3 Fatty Acids (FISH OIL) 1000 MG CAPS Take by mouth.    Historical Provider, MD   BP 141/97 mmHg  Pulse 96  Temp(Src) 98 F (36.7 C) (Oral)  Resp 18  Ht 5\' 11"  (1.803 m)  Wt 88.451 kg  BMI 27.21 kg/m2  SpO2 98% Physical Exam  Constitutional: He appears well-developed and well-nourished. No distress.  HENT:  Head: Normocephalic and atraumatic.  Eyes: Conjunctivae are normal.  Cardiovascular: Normal rate, regular rhythm and intact distal pulses.   Pulmonary/Chest: Effort normal.  Musculoskeletal:  Full ROM in left ankle and foot. Tenderness with slight swelling and contusion to the lateral left ankle and foot. Pt is able to bear weight on the left foot and ankle. CMS intact distal to the injury.  Neurological: He is alert.  No sensory deficits. Strength 5/5 in the left ankle and  foot.   Skin: Skin is warm and dry. He is not diaphoretic.  Nursing note and vitals reviewed.   ED Course  Procedures (including critical care time) Labs Review Labs Reviewed - No data to display  Imaging Review Dg Ankle Complete Left  09/15/2015  CLINICAL DATA:  LEFT ankle pain.  This began 1 week ago. EXAM: LEFT ANKLE COMPLETE - 3+ VIEW COMPARISON:  None. FINDINGS: There is no evidence of fracture, dislocation, or joint effusion. There is no evidence of arthropathy or other focal bone abnormality. Mild soft tissue swelling. IMPRESSION: Negative for fracture. Electronically Signed   By: Elsie Stain M.D.   On: 09/15/2015 08:41   Dg Foot Complete  Left  09/15/2015  CLINICAL DATA:  Pt reports he got his left foot caught in a blanket 1 week ago while he was trying to get up and has been having pain, swelling, and bruising on the lateral side of left foot and ankle since then; he has been unable to bear weight * EXAM: LEFT FOOT - COMPLETE 3+ VIEW COMPARISON:  None. FINDINGS: No fracture or dislocation of mid foot or forefoot. The phalanges are normal. The calcaneus is normal. No soft tissue abnormality. IMPRESSION: No acute osseous abnormality. Electronically Signed   By: Genevive Bi M.D.   On: 09/15/2015 08:39   I have personally reviewed and evaluated these images as part of my medical decision-making.   EKG Interpretation None      MDM   Final diagnoses:  Ankle pain, left    Sean Hale presents with left foot and ankle pain due to an injury that occurred 1 week ago.  Suspect a soft tissue injury. X-ray showed no acute abnormality. Patient to follow up with orthopedics should symptoms continue. ASO ankle brace placed. Home care and return precautions discussed. Patient voiced understanding of these instructions and is comfortable with discharge.  Filed Vitals:   09/15/15 0724 09/15/15 0857  BP: 147/92 141/97  Pulse: 95 96  Temp: 97.6 F (36.4 C) 98 F (36.7 C)  TempSrc: Oral Oral  Resp: 18 18  Height:  (1.803 m)   Weight: 88.451 kg   SpO2: 98% 98%       Anselm Pancoast, PA-C 09/15/15 0914  Laurence Spates, MD 09/15/15 1032

## 2015-09-15 NOTE — Discharge Instructions (Signed)
You have been seen today for ankle pain. Your imaging showed no abnormalities. Follow up with orthopedics for further evaluation and treatment of this issue. Follow up with orthopedics should symptoms continue. Follow up with PCP as needed. Return to ED should symptoms worsen.

## 2015-09-15 NOTE — ED Notes (Addendum)
PT reports he injured his LT foot and ankle last Sunday. Pt reports he lives on the 3rd floor apt. And due to pain in Lt ankle and foot can not go up stairs. Pt reports he takes  Hydrocodone for previous injury to RT ankle. Pt rates pain to LT ankle to be 6/10.

## 2015-09-16 ENCOUNTER — Encounter: Payer: Self-pay | Admitting: Family Medicine

## 2015-09-16 ENCOUNTER — Ambulatory Visit (INDEPENDENT_AMBULATORY_CARE_PROVIDER_SITE_OTHER): Payer: Managed Care, Other (non HMO) | Admitting: Family Medicine

## 2015-09-16 VITALS — BP 143/92 | HR 86 | Temp 97.7°F | Resp 16 | Ht 70.0 in | Wt 197.5 lb

## 2015-09-16 DIAGNOSIS — S93402D Sprain of unspecified ligament of left ankle, subsequent encounter: Secondary | ICD-10-CM | POA: Diagnosis not present

## 2015-09-16 MED ORDER — HYDROCODONE-ACETAMINOPHEN 7.5-300 MG PO TABS: ORAL_TABLET | ORAL | Status: DC

## 2015-09-16 NOTE — Patient Instructions (Signed)

## 2015-09-16 NOTE — Progress Notes (Signed)
OFFICE VISIT  09/17/2015   CC:  Chief Complaint  Patient presents with  . Follow-up    ER visit for ankle pain     HPI:    Patient is a 45 y.o. Caucasian male who presents for ED visit f/u for left ankle pain. Went to ED 09/15/15 due to a L ankle "rolling" injury that occurred a week prior.  X-ray there showed no acute abnormality.  He was fitted with an ASO ankle brace and told to f/u with ortho should symptoms continue. Alteration of gait made him put more wt on R ankle so his chronic pain in that ankle required more pain med---ultimately he has run out of pain meds a week early and needs new rx, also needs note for work. He says it looks better over the last 3d.  Past Medical History  Diagnosis Date  . Mild intermittent asthma   . Urinary frequency   . Health maintenance examination 04/26/2014  . Allergic rhinitis     pollen (+other?)  . Chronic allergic conjunctivitis   . Chronic pain syndrome     R ankle, intermittent pain since surgery 2007  . Chronic pain     PT reports he takes hydrocodone for chronic pain to RT ankle.    Past Surgical History  Procedure Laterality Date  . Hypospadias correction    . Wisdom tooth extraction    . Ankle fracture surgery      Right ankle surgery (pins in) 2007    Outpatient Prescriptions Prior to Visit  Medication Sig Dispense Refill  . Multiple Vitamin (MULTIVITAMIN) tablet Take 1 tablet by mouth daily.    . naproxen (NAPROSYN) 500 MG tablet Take 1 tablet (500 mg total) by mouth 2 (two) times daily. 30 tablet 0  . Omega-3 Fatty Acids (FISH OIL) 1000 MG CAPS Take by mouth.    . Hydrocodone-Acetaminophen 7.5-300 MG TABS 1-2 tabs q6h prn right ankle pain 30 each 0   No facility-administered medications prior to visit.    Allergies  Allergen Reactions  . Anectine [Succinylcholine Chloride]     Stops heart.  . Codeine     Insomnia     ROS As per HPI  PE: Blood pressure 143/92, pulse 86, temperature 97.7 F (36.5 C),  temperature source Oral, resp. rate 16, height 5\' 10"  (1.778 m), weight 197 lb 8 oz (89.585 kg), SpO2 97 %. Gen: Alert, well appearing.  Patient is oriented to person, place, time, and situation. AFFECT: pleasant, lucid thought and speech. Left calf w/out tenderness to squeeze. No significant ankle swelling.  Very mild ecchymoses lateral aspect of foot. Signifi TTp over lateral ankle soft tissues.  Ankle ROM intact.  N/V intact.  LABS:  None today  IMPRESSION AND PLAN:  Left ankle sprain; not getting better appropriately likely b/c he has continued to bear wt on it. Dispensed crutches to pt today. Continue ankle lace up support. Wrote vicodin Rx today, #30. Work note excusing him from 3/19-3/23, 2017. Home PT exercises given to pt today. Formal PT if not improving in 1 wk.  An After Visit Summary was printed and given to the patient.  FOLLOW UP: Return if symptoms worsen or fail to improve.  Signed:  Santiago BumpersPhil McGowen, MD           09/17/2015

## 2015-09-16 NOTE — Progress Notes (Signed)
Pre visit review using our clinic review tool, if applicable. No additional management support is needed unless otherwise documented below in the visit note. 

## 2015-09-21 DIAGNOSIS — R7301 Impaired fasting glucose: Secondary | ICD-10-CM

## 2015-09-21 HISTORY — DX: Impaired fasting glucose: R73.01

## 2015-09-27 ENCOUNTER — Encounter: Payer: Self-pay | Admitting: Family Medicine

## 2015-09-27 ENCOUNTER — Ambulatory Visit (INDEPENDENT_AMBULATORY_CARE_PROVIDER_SITE_OTHER): Payer: Managed Care, Other (non HMO) | Admitting: Family Medicine

## 2015-09-27 VITALS — BP 128/85 | HR 79 | Temp 97.9°F | Resp 16 | Ht 70.25 in | Wt 196.5 lb

## 2015-09-27 DIAGNOSIS — Z0000 Encounter for general adult medical examination without abnormal findings: Secondary | ICD-10-CM | POA: Diagnosis not present

## 2015-09-27 LAB — COMPREHENSIVE METABOLIC PANEL
ALBUMIN: 4.2 g/dL (ref 3.5–5.2)
ALT: 17 U/L (ref 0–53)
AST: 13 U/L (ref 0–37)
Alkaline Phosphatase: 81 U/L (ref 39–117)
BUN: 10 mg/dL (ref 6–23)
CHLORIDE: 101 meq/L (ref 96–112)
CO2: 29 meq/L (ref 19–32)
Calcium: 9.7 mg/dL (ref 8.4–10.5)
Creatinine, Ser: 0.91 mg/dL (ref 0.40–1.50)
GFR: 96.77 mL/min (ref >60.00)
Glucose, Bld: 104 mg/dL — ABNORMAL HIGH (ref 70–99)
POTASSIUM: 4.3 meq/L (ref 3.5–5.1)
SODIUM: 138 meq/L (ref 135–145)
Total Bilirubin: 0.4 mg/dL (ref 0.2–1.2)
Total Protein: 7.4 g/dL (ref 6.0–8.3)

## 2015-09-27 LAB — LIPID PANEL
CHOL/HDL RATIO: 5
Cholesterol: 216 mg/dL — ABNORMAL HIGH (ref 0–200)
HDL: 46.6 mg/dL (ref >39.00)
LDL CALC: 139 mg/dL — AB (ref 0–99)
NONHDL: 169.02
Triglycerides: 148 mg/dL (ref 0.0–149.0)
VLDL: 29.6 mg/dL (ref 0.0–40.0)

## 2015-09-27 LAB — CBC WITH DIFFERENTIAL/PLATELET
BASOS PCT: 0.6 % (ref 0.0–3.0)
Basophils Absolute: 0 10*3/uL (ref 0.0–0.1)
EOS PCT: 3.8 % (ref 0.0–5.0)
Eosinophils Absolute: 0.3 10*3/uL (ref 0.0–0.7)
HEMATOCRIT: 43.9 % (ref 39.0–52.0)
HEMOGLOBIN: 14.8 g/dL (ref 13.0–17.0)
LYMPHS PCT: 29.8 % (ref 12.0–46.0)
Lymphs Abs: 2.1 10*3/uL (ref 0.7–4.0)
MCHC: 33.8 g/dL (ref 30.0–36.0)
MCV: 85 fl (ref 78.0–100.0)
Monocytes Absolute: 0.6 10*3/uL (ref 0.1–1.0)
Monocytes Relative: 8.3 % (ref 3.0–12.0)
NEUTROS ABS: 4.1 10*3/uL (ref 1.4–7.7)
Neutrophils Relative %: 57.5 % (ref 43.0–77.0)
PLATELETS: 330 10*3/uL (ref 150.0–400.0)
RBC: 5.16 Mil/uL (ref 4.22–5.81)
RDW: 14.3 % (ref 11.5–15.5)
WBC: 7.2 10*3/uL (ref 4.0–10.5)

## 2015-09-27 LAB — TSH: TSH: 1.58 u[IU]/mL (ref 0.35–4.50)

## 2015-09-27 NOTE — Progress Notes (Signed)
Office Note 09/27/2015  CC:  Chief Complaint  Patient presents with  . Annual Exam    Pt is fasting.     HPI:  Sean Hale is a 45 y.o. White male who is here for annual health maintenance exam. No acute complaints.   Past Medical History  Diagnosis Date  . Mild intermittent asthma   . Urinary frequency   . Allergic rhinitis     pollen (+other?)  . Chronic allergic conjunctivitis   . Chronic pain syndrome     R ankle, intermittent pain since surgery 2007  . Chronic pain     PT reports he takes hydrocodone for chronic pain to RT ankle.  . Tobacco abuse     Quit 2014 (44 pack-yr hx)    Past Surgical History  Procedure Laterality Date  . Hypospadias correction    . Wisdom tooth extraction    . Ankle fracture surgery      Right ankle surgery (pins in) 2007    Family History  Problem Relation Age of Onset  . Cancer Mother   . Diabetes Father   . Graves' disease Sister   . Diabetes Brother   . Cancer Maternal Uncle   . Cancer Maternal Grandmother     Social History   Social History  . Marital Status: Single    Spouse Name: N/A  . Number of Children: N/A  . Years of Education: N/A   Occupational History  . Not on file.   Social History Main Topics  . Smoking status: Former Smoker -- 2.00 packs/day for 22 years    Types: Cigarettes    Quit date: 09/02/2012  . Smokeless tobacco: Never Used  . Alcohol Use: No  . Drug Use: No  . Sexual Activity: Not on file   Other Topics Concern  . Not on file   Social History Narrative   Single, no children.   [place]ig from [place].   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   Tob: 40 pack-yr hx.  Alcohol: none currently.  Drugs: none.          Outpatient Prescriptions Prior to Visit  Medication Sig Dispense Refill  . Hydrocodone-Acetaminophen 7.5-300 MG TABS 1-2 tabs q6h prn right ankle pain 30 each 0  . Multiple Vitamin (MULTIVITAMIN) tablet Take 1 tablet by mouth daily.    . naproxen (NAPROSYN) 500 MG  tablet Take 1 tablet (500 mg total) by mouth 2 (two) times daily. 30 tablet 0  . Omega-3 Fatty Acids (FISH OIL) 1000 MG CAPS Take by mouth.     No facility-administered medications prior to visit.    Allergies  Allergen Reactions  . Anectine [Succinylcholine Chloride]     Stops heart.  . Codeine     Insomnia     ROS Review of Systems  Constitutional: Negative for fever, chills, appetite change and fatigue.  HENT: Negative for congestion, dental problem, ear pain and sore throat.   Eyes: Negative for discharge, redness and visual disturbance.  Respiratory: Negative for cough, chest tightness, shortness of breath and wheezing.   Cardiovascular: Negative for chest pain, palpitations and leg swelling.  Gastrointestinal: Negative for nausea, vomiting, abdominal pain, diarrhea and blood in stool.  Genitourinary: Negative for dysuria, urgency, frequency, hematuria, flank pain, difficulty urinating and penile pain.  Musculoskeletal: Positive for arthralgias (R ankle, crhonic waxing/waning pain). Negative for myalgias, back pain, joint swelling and neck stiffness.  Skin: Negative for pallor and rash.  Neurological: Negative for dizziness, speech difficulty, weakness  and headaches.  Hematological: Negative for adenopathy. Does not bruise/bleed easily.  Psychiatric/Behavioral: Negative for confusion and sleep disturbance. The patient is not nervous/anxious.     PE; Blood pressure 128/85, pulse 79, temperature 97.9 F (36.6 C), temperature source Oral, resp. rate 16, height 5' 10.25" (1.784 m), weight 196 lb 8 oz (89.132 kg), SpO2 97 %. Gen: Alert, well appearing.  Patient is oriented to person, place, time, and situation. AFFECT: pleasant, lucid thought and speech. ENT: Ears: EACs clear, normal epithelium.  TMs with good light reflex and landmarks bilaterally.  Eyes: no injection, icteris, swelling, or exudate.  EOMI, PERRLA. Nose: no drainage or turbinate edema/swelling.  No injection or  focal lesion.  Mouth: lips without lesion/swelling.  Oral mucosa pink and moist.  Dentition intact and without obvious caries or gingival swelling.  Oropharynx without erythema, exudate, or swelling.  Neck: supple/nontender.  No LAD, mass, or TM.  Carotid pulses 2+ bilaterally, without bruits. CV: RRR, no m/r/g.   LUNGS: CTA bilat, nonlabored resps, good aeration in all lung fields. ABD: soft, NT, ND, BS normal.  No hepatospenomegaly or mass.  No bruits. EXT: no clubbing, cyanosis, or edema.  Musculoskeletal: no joint swelling, erythema, warmth, or tenderness.  ROM of all joints intact. Skin - no sores or suspicious lesions or rashes or color changes  Pertinent labs:  Lab Results  Component Value Date   TSH 0.98 04/04/2014   Lab Results  Component Value Date   WBC 8.7 04/04/2014   HGB 15.2 04/04/2014   HCT 45.7 04/04/2014   MCV 86.7 04/04/2014   PLT 293.0 04/04/2014   Lab Results  Component Value Date   CREATININE 1.0 04/04/2014   BUN 16 04/04/2014   NA 139 04/04/2014   K 4.7 04/04/2014   CL 101 04/04/2014   CO2 28 04/04/2014   Lab Results  Component Value Date   ALT 15 04/04/2014   AST 15 04/04/2014   ALKPHOS 84 04/04/2014   BILITOT 0.7 04/04/2014   Lab Results  Component Value Date   CHOL 181 04/04/2014   Lab Results  Component Value Date   HDL 42.50 04/04/2014   Lab Results  Component Value Date   LDLCALC 113* 04/04/2014   Lab Results  Component Value Date   TRIG 130.0 04/04/2014   Lab Results  Component Value Date   CHOLHDL 4 04/04/2014   ASSESSMENT AND PLAN:   Health maintenance exam: Reviewed age and gender appropriate health maintenance issues (prudent diet, regular exercise, health risks of tobacco and excessive alcohol, use of seatbelts, fire alarms in home, use of sunscreen).  Also reviewed age and gender appropriate health screening as well as vaccine recommendations. Vaccines UTD. Fasting HP labs drawn today. Of note, his most recent UDS was  08/05/15 and it showed appropriate results.  An After Visit Summary was printed and given to the patient.  FOLLOW UP:  Return in about 4 months (around 01/27/2016) for routine chronic illness f/u.  Signed:  Santiago Bumpers, MD           09/27/2015

## 2015-09-27 NOTE — Progress Notes (Signed)
Pre visit review using our clinic review tool, if applicable. No additional management support is needed unless otherwise documented below in the visit note. 

## 2015-09-30 ENCOUNTER — Other Ambulatory Visit (INDEPENDENT_AMBULATORY_CARE_PROVIDER_SITE_OTHER): Payer: Managed Care, Other (non HMO)

## 2015-09-30 DIAGNOSIS — R7301 Impaired fasting glucose: Secondary | ICD-10-CM

## 2015-09-30 LAB — HEMOGLOBIN A1C: HEMOGLOBIN A1C: 5.9 % (ref 4.6–6.5)

## 2015-10-01 ENCOUNTER — Encounter: Payer: Self-pay | Admitting: Family Medicine

## 2015-10-16 DIAGNOSIS — Z7689 Persons encountering health services in other specified circumstances: Secondary | ICD-10-CM

## 2015-10-23 ENCOUNTER — Other Ambulatory Visit: Payer: Self-pay | Admitting: *Deleted

## 2015-10-23 MED ORDER — HYDROCODONE-ACETAMINOPHEN 7.5-300 MG PO TABS: ORAL_TABLET | ORAL | Status: DC

## 2015-10-23 NOTE — Telephone Encounter (Signed)
RF request for hydrocodone/apap LOV: 09/27/15 Next ov: None Last written: 09/16/15 #30 w/ [id number]  Please advise. Thanks.

## 2015-10-24 NOTE — Telephone Encounter (Signed)
Rx put up front for p/u. Pt advised and voiced understanding.   

## 2015-11-22 ENCOUNTER — Other Ambulatory Visit: Payer: Self-pay | Admitting: *Deleted

## 2015-11-22 MED ORDER — HYDROCODONE-ACETAMINOPHEN 7.5-300 MG PO TABS: ORAL_TABLET | ORAL | Status: DC

## 2015-11-22 NOTE — Telephone Encounter (Signed)
Pt LMOM on 11/22/15 at 8:07am requesting refill.  RF request for hydro/apap LOV: 09/27/15 Next ov: None Last written: 10/23/15 #30 w/ 0RF  Please advise. Thanks.

## 2015-11-22 NOTE — Telephone Encounter (Signed)
Left message on cell vm stating that Rx is ready for p/u.

## 2015-12-13 ENCOUNTER — Ambulatory Visit (INDEPENDENT_AMBULATORY_CARE_PROVIDER_SITE_OTHER): Payer: Managed Care, Other (non HMO) | Admitting: Family Medicine

## 2015-12-13 ENCOUNTER — Encounter: Payer: Self-pay | Admitting: *Deleted

## 2015-12-13 ENCOUNTER — Encounter: Payer: Self-pay | Admitting: Family Medicine

## 2015-12-13 VITALS — BP 164/89 | HR 91 | Temp 97.8°F | Resp 16 | Ht 70.25 in | Wt 195.5 lb

## 2015-12-13 DIAGNOSIS — Z8781 Personal history of (healed) traumatic fracture: Secondary | ICD-10-CM | POA: Diagnosis not present

## 2015-12-13 DIAGNOSIS — G8929 Other chronic pain: Secondary | ICD-10-CM | POA: Diagnosis not present

## 2015-12-13 DIAGNOSIS — S39012A Strain of muscle, fascia and tendon of lower back, initial encounter: Secondary | ICD-10-CM | POA: Diagnosis not present

## 2015-12-13 DIAGNOSIS — M25571 Pain in right ankle and joints of right foot: Secondary | ICD-10-CM

## 2015-12-13 MED ORDER — HYDROCODONE-ACETAMINOPHEN 7.5-300 MG PO TABS: ORAL_TABLET | ORAL | Status: DC

## 2015-12-13 NOTE — Patient Instructions (Signed)
Take naproxen 500 mg, 1 tab twice daily with food for TEN days.   Low Back Strain With Rehab A strain is an injury in which a tendon or muscle is torn. The muscles and tendons of the lower back are vulnerable to strains. However, these muscles and tendons are very strong and require a great force to be injured. Strains are classified into three categories. Grade 1 strains cause pain, but the tendon is not lengthened. Grade 2 strains include a lengthened ligament, due to the ligament being stretched or partially ruptured. With grade 2 strains there is still function, although the function may be decreased. Grade 3 strains involve a complete tear of the tendon or muscle, and function is usually impaired. SYMPTOMS   Pain in the lower back.  Pain that affects one side more than the other.  Pain that gets worse with movement and may be felt in the hip, buttocks, or back of the thigh.  Muscle spasms of the muscles in the back.  Swelling along the muscles of the back.  Loss of strength of the back muscles.  Crackling sound (crepitation) when the muscles are touched. CAUSES  Lower back strains occur when a force is placed on the muscles or tendons that is greater than they can handle. Common causes of injury include:  Prolonged overuse of the muscle-tendon units in the lower back, usually from incorrect posture.  A single violent injury or force applied to the back. RISK INCREASES WITH:  Sports that involve twisting forces on the spine or a lot of bending at the waist (football, rugby, weightlifting, bowling, golf, tennis, speed skating, racquetball, swimming, running, gymnastics, diving).  Poor strength and flexibility.  Failure to warm up properly before activity.  Family history of lower back pain or disk disorders.  Previous back injury or surgery (especially fusion).  Poor posture with lifting, especially heavy objects.  Prolonged sitting, especially with poor  posture. PREVENTION   Learn and use proper posture when sitting or lifting (maintain proper posture when sitting, lift using the knees and legs, not at the waist).  Warm up and stretch properly before activity.  Allow for adequate recovery between workouts.  Maintain physical fitness:  Strength, flexibility, and endurance.  Cardiovascular fitness. PROGNOSIS  If treated properly, lower back strains usually heal within 6 weeks. RELATED COMPLICATIONS   Recurring symptoms, resulting in a chronic problem.  Chronic inflammation, scarring, and partial muscle-tendon tear.  Delayed healing or resolution of symptoms.  Prolonged disability. TREATMENT  Treatment first involves the use of ice and medicine, to reduce pain and inflammation. The use of strengthening and stretching exercises may help reduce pain with activity. These exercises may be performed at home or with a therapist. Severe injuries may require referral to a therapist for further evaluation and treatment, such as ultrasound. Your caregiver may advise that you wear a back brace or corset, to help reduce pain and discomfort. Often, prolonged bed rest results in greater harm then benefit. Corticosteroid injections may be recommended. However, these should be reserved for the most serious cases. It is important to avoid using your back when lifting objects. At night, sleep on your back on a firm mattress with a pillow placed under your knees. If non-surgical treatment is unsuccessful, surgery may be needed.  MEDICATION   If pain medicine is needed, nonsteroidal anti-inflammatory medicines (aspirin and ibuprofen), or other minor pain relievers (acetaminophen), are often advised.  Do not take pain medicine for 7 days before surgery.  Prescription  pain relievers may be given, if your caregiver thinks they are needed. Use only as directed and only as much as you need.  Ointments applied to the skin may be helpful.  Corticosteroid  injections may be given by your caregiver. These injections should be reserved for the most serious cases, because they may only be given a certain number of times. HEAT AND COLD  Cold treatment (icing) should be applied for 10 to 15 minutes every 2 to 3 hours for inflammation and pain, and immediately after activity that aggravates your symptoms. Use ice packs or an ice massage.  Heat treatment may be used before performing stretching and strengthening activities prescribed by your caregiver, physical therapist, or athletic trainer. Use a heat pack or a warm water soak. SEEK MEDICAL CARE IF:   Symptoms get worse or do not improve in 2 to 4 weeks, despite treatment.  You develop numbness, weakness, or loss of bowel or bladder function.  New, unexplained symptoms develop. (Drugs used in treatment may produce side effects.) EXERCISES  RANGE OF MOTION (ROM) AND STRETCHING EXERCISES - Low Back Strain Most people with lower back pain will find that their symptoms get worse with excessive bending forward (flexion) or arching at the lower back (extension). The exercises which will help resolve your symptoms will focus on the opposite motion.  Your physician, physical therapist or athletic trainer will help you determine which exercises will be most helpful to resolve your lower back pain. Do not complete any exercises without first consulting with your caregiver. Discontinue any exercises which make your symptoms worse until you speak to your caregiver.  If you have pain, numbness or tingling which travels down into your buttocks, leg or foot, the goal of the therapy is for these symptoms to move closer to your back and eventually resolve. Sometimes, these leg symptoms will get better, but your lower back pain may worsen. This is typically an indication of progress in your rehabilitation. Be very alert to any changes in your symptoms and the activities in which you participated in the 24 hours prior to the  change. Sharing this information with your caregiver will allow him/her to most efficiently treat your condition.  These exercises may help you when beginning to rehabilitate your injury. Your symptoms may resolve with or without further involvement from your physician, physical therapist or athletic trainer. While completing these exercises, remember:  Restoring tissue flexibility helps normal motion to return to the joints. This allows healthier, less painful movement and activity.  An effective stretch should be held for at least 30 seconds.  A stretch should never be painful. You should only feel a gentle lengthening or release in the stretched tissue. FLEXION RANGE OF MOTION AND STRETCHING EXERCISES: STRETCH - Flexion, Single Knee to Chest   Lie on a firm bed or floor with both legs extended in front of you.  Keeping one leg in contact with the floor, bring your opposite knee to your chest. Hold your leg in place by either grabbing behind your thigh or at your knee.  Pull until you feel a gentle stretch in your lower back. Hold __________ seconds.  Slowly release your grasp and repeat the exercise with the opposite side. Repeat __________ times. Complete this exercise __________ times per day.  STRETCH - Flexion, Double Knee to Chest   Lie on a firm bed or floor with both legs extended in front of you.  Keeping one leg in contact with the floor, bring your opposite  knee to your chest.  Tense your stomach muscles to support your back and then lift your other knee to your chest. Hold your legs in place by either grabbing behind your thighs or at your knees.  Pull both knees toward your chest until you feel a gentle stretch in your lower back. Hold __________ seconds.  Tense your stomach muscles and slowly return one leg at a time to the floor. Repeat __________ times. Complete this exercise __________ times per day.  STRETCH - Low Trunk Rotation  Lie on a firm bed or floor.  Keeping your legs in front of you, bend your knees so they are both pointed toward the ceiling and your feet are flat on the floor.  Extend your arms out to the side. This will stabilize your upper body by keeping your shoulders in contact with the floor.  Gently and slowly drop both knees together to one side until you feel a gentle stretch in your lower back. Hold for __________ seconds.  Tense your stomach muscles to support your lower back as you bring your knees back to the starting position. Repeat the exercise to the other side. Repeat __________ times. Complete this exercise __________ times per day  EXTENSION RANGE OF MOTION AND FLEXIBILITY EXERCISES: STRETCH - Extension, Prone on Elbows   Lie on your stomach on the floor, a bed will be too soft. Place your palms about shoulder width apart and at the height of your head.  Place your elbows under your shoulders. If this is too painful, stack pillows under your chest.  Allow your body to relax so that your hips drop lower and make contact more completely with the floor.  Hold this position for __________ seconds.  Slowly return to lying flat on the floor. Repeat __________ times. Complete this exercise __________ times per day.  RANGE OF MOTION - Extension, Prone Press Ups  Lie on your stomach on the floor, a bed will be too soft. Place your palms about shoulder width apart and at the height of your head.  Keeping your back as relaxed as possible, slowly straighten your elbows while keeping your hips on the floor. You may adjust the placement of your hands to maximize your comfort. As you gain motion, your hands will come more underneath your shoulders.  Hold this position __________ seconds.  Slowly return to lying flat on the floor. Repeat __________ times. Complete this exercise __________ times per day.  RANGE OF MOTION- Quadruped, Neutral Spine   Assume a hands and knees position on a firm surface. Keep your hands under  your shoulders and your knees under your hips. You may place padding under your knees for comfort.  Drop your head and point your tail bone toward the ground below you. This will round out your lower back like an angry cat. Hold this position for __________ seconds.  Slowly lift your head and release your tail bone so that your back sags into a large arch, like an old horse.  Hold this position for __________ seconds.  Repeat this until you feel limber in your lower back.  Now, find your "sweet spot." This will be the most comfortable position somewhere between the two previous positions. This is your neutral spine. Once you have found this position, tense your stomach muscles to support your lower back.  Hold this position for __________ seconds. Repeat __________ times. Complete this exercise __________ times per day.  STRENGTHENING EXERCISES - Low Back Strain These exercises may help  you when beginning to rehabilitate your injury. These exercises should be done near your "sweet spot." This is the neutral, low-back arch, somewhere between fully rounded and fully arched, that is your least painful position. When performed in this safe range of motion, these exercises can be used for people who have either a flexion or extension based injury. These exercises may resolve your symptoms with or without further involvement from your physician, physical therapist or athletic trainer. While completing these exercises, remember:   Muscles can gain both the endurance and the strength needed for everyday activities through controlled exercises.  Complete these exercises as instructed by your physician, physical therapist or athletic trainer. Increase the resistance and repetitions only as guided.  You may experience muscle soreness or fatigue, but the pain or discomfort you are trying to eliminate should never worsen during these exercises. If this pain does worsen, stop and make certain you are  following the directions exactly. If the pain is still present after adjustments, discontinue the exercise until you can discuss the trouble with your caregiver. STRENGTHENING - Deep Abdominals, Pelvic Tilt  Lie on a firm bed or floor. Keeping your legs in front of you, bend your knees so they are both pointed toward the ceiling and your feet are flat on the floor.  Tense your lower abdominal muscles to press your lower back into the floor. This motion will rotate your pelvis so that your tail bone is scooping upwards rather than pointing at your feet or into the floor.  With a gentle tension and even breathing, hold this position for __________ seconds. Repeat __________ times. Complete this exercise __________ times per day.  STRENGTHENING - Abdominals, Crunches   Lie on a firm bed or floor. Keeping your legs in front of you, bend your knees so they are both pointed toward the ceiling and your feet are flat on the floor. Cross your arms over your chest.  Slightly tip your chin down without bending your neck.  Tense your abdominals and slowly lift your trunk high enough to just clear your shoulder blades. Lifting higher can put excessive stress on the lower back and does not further strengthen your abdominal muscles.  Control your return to the starting position. Repeat __________ times. Complete this exercise __________ times per day.  STRENGTHENING - Quadruped, Opposite UE/LE Lift   Assume a hands and knees position on a firm surface. Keep your hands under your shoulders and your knees under your hips. You may place padding under your knees for comfort.  Find your neutral spine and gently tense your abdominal muscles so that you can maintain this position. Your shoulders and hips should form a rectangle that is parallel with the floor and is not twisted.  Keeping your trunk steady, lift your right hand no higher than your shoulder and then your left leg no higher than your hip. Make sure  you are not holding your breath. Hold this position __________ seconds.  Continuing to keep your abdominal muscles tense and your back steady, slowly return to your starting position. Repeat with the opposite arm and leg. Repeat __________ times. Complete this exercise __________ times per day.  STRENGTHENING - Lower Abdominals, Double Knee Lift  Lie on a firm bed or floor. Keeping your legs in front of you, bend your knees so they are both pointed toward the ceiling and your feet are flat on the floor.  Tense your abdominal muscles to brace your lower back and slowly lift both of  your knees until they come over your hips. Be certain not to hold your breath.  Hold __________ seconds. Using your abdominal muscles, return to the starting position in a slow and controlled manner. Repeat __________ times. Complete this exercise __________ times per day.  POSTURE AND BODY MECHANICS CONSIDERATIONS - Low Back Strain Keeping correct posture when sitting, standing or completing your activities will reduce the stress put on different body tissues, allowing injured tissues a chance to heal and limiting painful experiences. The following are general guidelines for improved posture. Your physician or physical therapist will provide you with any instructions specific to your needs. While reading these guidelines, remember:  The exercises prescribed by your provider will help you have the flexibility and strength to maintain correct postures.  The correct posture provides the best environment for your joints to work. All of your joints have less wear and tear when properly supported by a spine with good posture. This means you will experience a healthier, less painful body.  Correct posture must be practiced with all of your activities, especially prolonged sitting and standing. Correct posture is as important when doing repetitive low-stress activities (typing) as it is when doing a single heavy-load activity  (lifting). RESTING POSITIONS Consider which positions are most painful for you when choosing a resting position. If you have pain with flexion-based activities (sitting, bending, stooping, squatting), choose a position that allows you to rest in a less flexed posture. You would want to avoid curling into a fetal position on your side. If your pain worsens with extension-based activities (prolonged standing, working overhead), avoid resting in an extended position such as sleeping on your stomach. Most people will find more comfort when they rest with their spine in a more neutral position, neither too rounded nor too arched. Lying on a non-sagging bed on your side with a pillow between your knees, or on your back with a pillow under your knees will often provide some relief. Keep in mind, being in any one position for a prolonged period of time, no matter how correct your posture, can still lead to stiffness. PROPER SITTING POSTURE In order to minimize stress and discomfort on your spine, you must sit with correct posture. Sitting with good posture should be effortless for a healthy body. Returning to good posture is a gradual process. Many people can work toward this most comfortably by using various supports until they have the flexibility and strength to maintain this posture on their own. When sitting with proper posture, your ears will fall over your shoulders and your shoulders will fall over your hips. You should use the back of the chair to support your upper back. Your lower back will be in a neutral position, just slightly arched. You may place a small pillow or folded towel at the base of your lower back for support.  When working at a desk, create an environment that supports good, upright posture. Without extra support, muscles tire, which leads to excessive strain on joints and other tissues. Keep these recommendations in mind: CHAIR:  A chair should be able to slide under your desk when your  back makes contact with the back of the chair. This allows you to work closely.  The chair's height should allow your eyes to be level with the upper part of your monitor and your hands to be slightly lower than your elbows. BODY POSITION  Your feet should make contact with the floor. If this is not possible, use a foot  rest.  Keep your ears over your shoulders. This will reduce stress on your neck and lower back. INCORRECT SITTING POSTURES  If you are feeling tired and unable to assume a healthy sitting posture, do not slouch or slump. This puts excessive strain on your back tissues, causing more damage and pain. Healthier options include:  Using more support, like a lumbar pillow.  Switching tasks to something that requires you to be upright or walking.  Talking a brief walk.  Lying down to rest in a neutral-spine position. PROLONGED STANDING WHILE SLIGHTLY LEANING FORWARD  When completing a task that requires you to lean forward while standing in one place for a long time, place either foot up on a stationary 2-4 inch high object to help maintain the best posture. When both feet are on the ground, the lower back tends to lose its slight inward curve. If this curve flattens (or becomes too large), then the back and your other joints will experience too much stress, tire more quickly, and can cause pain. CORRECT STANDING POSTURES Proper standing posture should be assumed with all daily activities, even if they only take a few moments, like when brushing your teeth. As in sitting, your ears should fall over your shoulders and your shoulders should fall over your hips. You should keep a slight tension in your abdominal muscles to brace your spine. Your tailbone should point down to the ground, not behind your body, resulting in an over-extended swayback posture.  INCORRECT STANDING POSTURES  Common incorrect standing postures include a forward head, locked knees and/or an excessive  swayback. WALKING Walk with an upright posture. Your ears, shoulders and hips should all line-up. PROLONGED ACTIVITY IN A FLEXED POSITION When completing a task that requires you to bend forward at your waist or lean over a low surface, try to find a way to stabilize 3 out of 4 of your limbs. You can place a hand or elbow on your thigh or rest a knee on the surface you are reaching across. This will provide you more stability so that your muscles do not fatigue as quickly. By keeping your knees relaxed, or slightly bent, you will also reduce stress across your lower back. CORRECT LIFTING TECHNIQUES DO :   Assume a wide stance. This will provide you more stability and the opportunity to get as close as possible to the object which you are lifting.  Tense your abdominals to brace your spine. Bend at the knees and hips. Keeping your back locked in a neutral-spine position, lift using your leg muscles. Lift with your legs, keeping your back straight.  Test the weight of unknown objects before attempting to lift them.  Try to keep your elbows locked down at your sides in order get the best strength from your shoulders when carrying an object.  Always ask for help when lifting heavy or awkward objects. INCORRECT LIFTING TECHNIQUES DO NOT:   Lock your knees when lifting, even if it is a small object.  Bend and twist. Pivot at your feet or move your feet when needing to change directions.  Assume that you can safely pick up even a paper clip without proper posture.   This information is not intended to replace advice given to you by your health care provider. Make sure you discuss any questions you have with your health care provider.   Document Released: 06/08/2005 Document Revised: 06/29/2014 Document Reviewed: 09/20/2008 [employer] Patient Education [employer]

## 2015-12-13 NOTE — Progress Notes (Signed)
Pre visit review using our clinic review tool, if applicable. No additional management support is needed unless otherwise documented below in the visit note. 

## 2015-12-13 NOTE — Progress Notes (Signed)
OFFICE VISIT  12/13/2015   CC:  Chief Complaint  Patient presents with  . Back Pain    x 4 days   HPI:    Patient is a 45 y.o. Caucasian male who presents for low back pain, onset 4 days ago when he bent over to pick something up-but he did not pick anything up b/c the pain hit first.  Initially worse on R side but eventually diffuse in low back region.  Hurts with sitting for long periods and also when he gets up and walks after sitting (initially).   The pain radiates up to mid back region on R side.  No radiation anywhere else.  No paresthesias.   He has taken a few of his vicodin pills that he has for chronic Rt ankle pain. He took muscle relaxer (can't recall name) that he had leftover from another MD, and this did help him some and he was able to sleep.  No tylenol/alleve/ibup, etc.  He asked for referral to pain mgmt clinic today b/c of chronic R ankle pain (see PMH/PSH sections below).   Past Medical History  Diagnosis Date  . Mild intermittent asthma   . Urinary frequency   . Allergic rhinitis     pollen (+other?)  . Chronic allergic conjunctivitis   . Chronic pain syndrome     R ankle, intermittent pain since surgery 2007  . Chronic pain     PT reports he takes hydrocodone for chronic pain to RT ankle.  . Tobacco abuse     Quit 2014 (44 pack-yr hx)  . IFG (impaired fasting glucose) 09/2015    Fasting gluc 107, A1c 5.9%    Past Surgical History  Procedure Laterality Date  . Hypospadias correction    . Wisdom tooth extraction    . Ankle fracture surgery      Right ankle surgery (pins in) 2007    Outpatient Prescriptions Prior to Visit  Medication Sig Dispense Refill  . Multiple Vitamin (MULTIVITAMIN) tablet Take 1 tablet by mouth daily.    . naproxen (NAPROSYN) 500 MG tablet Take 1 tablet (500 mg total) by mouth 2 (two) times daily. 30 tablet 0  . Omega-3 Fatty Acids (FISH OIL) 1000 MG CAPS Take by mouth.    . Hydrocodone-Acetaminophen 7.5-300 MG TABS 1-2 tabs  q6h prn right ankle pain 30 each 0   No facility-administered medications prior to visit.    Allergies  Allergen Reactions  . Anectine [Succinylcholine Chloride]     Stops heart.  . Codeine     Insomnia     ROS As per HPI  PE: Blood pressure 164/89, pulse 91, temperature 97.8 F (36.6 C), temperature source Oral, resp. rate 16, height 5' 10.25" (1.784 m), weight 195 lb 8 oz (88.678 kg), SpO2 96 %. Gen: Alert, well appearing.  Patient is oriented to person, place, time, and situation. BACK: forward flexion to 90deg, ext intact but hurts, lateral bending of L spine is mildly painful.  Rotation is not painful. TTP in bilat paraspinous soft tissues of L spine.  No central tenderness or facet tenderness. SI joints w/out TTP. Sitting SLR neg bilat. LE strength: 5/5 prox/dist bilat Patellar and achilles DTRs 2+ bilat  LABS:  none  IMPRESSION AND PLAN:  1) Acute low back strain: Discussed rehab: he chose home rehab/stretches--handout given today. Recommended naproxen 500 mg bid x 10d with food. I did give him his vicodin 7.5/300 rx today--which is a bit early, but he has used some  of these for his low back strain so he'll run out early. Note/excuse for work that he missed (all 5 days this week).  May return in 3d (monday).  2) Chronic right ankle pain s/p remote ankle fracture and surgery: pt requested pain mgmt referral today so I ordered this.  An After Visit Summary was printed and given to the patient.  FOLLOW UP: Return if symptoms worsen or fail to improve.  Signed:  Santiago BumpersPhil McGowen, MD           12/13/2015

## 2015-12-19 ENCOUNTER — Encounter: Payer: Self-pay | Admitting: Family Medicine

## 2015-12-19 ENCOUNTER — Telehealth: Payer: Self-pay | Admitting: Family Medicine

## 2015-12-19 NOTE — Telephone Encounter (Signed)
Letter printed. If he is not able to return to work 12/23/15 then he will need to come back in for follow up.-thx

## 2015-12-19 NOTE — Telephone Encounter (Signed)
Please advise. Thanks.  

## 2015-12-19 NOTE — Telephone Encounter (Signed)
Patient calling to report he was seen last week (12/13/15) for back pain.  He states he has not returned to work and is requesting a doctor's note to return to work on 12/23/15.  He states he lives on the third floor of his apartment and has a hard time getting in his truck.  He has being doing the exercises pcp recommended and seems to be helping some.

## 2015-12-20 NOTE — Telephone Encounter (Signed)
Letter put up front for p/u. Left detailed message on cell vm, okay per DPR.

## 2015-12-27 ENCOUNTER — Encounter: Payer: Self-pay | Admitting: *Deleted

## 2015-12-27 ENCOUNTER — Ambulatory Visit (INDEPENDENT_AMBULATORY_CARE_PROVIDER_SITE_OTHER): Payer: Managed Care, Other (non HMO) | Admitting: Family Medicine

## 2015-12-27 ENCOUNTER — Encounter: Payer: Self-pay | Admitting: Family Medicine

## 2015-12-27 VITALS — BP 151/106 | HR 78 | Temp 97.5°F | Resp 16 | Ht 70.25 in | Wt 196.8 lb

## 2015-12-27 DIAGNOSIS — F330 Major depressive disorder, recurrent, mild: Secondary | ICD-10-CM

## 2015-12-27 DIAGNOSIS — F4001 Agoraphobia with panic disorder: Secondary | ICD-10-CM

## 2015-12-27 MED ORDER — PAROXETINE HCL 20 MG PO TABS: 20.0000 mg | ORAL_TABLET | Freq: Every day | ORAL | Status: DC

## 2015-12-27 MED ORDER — CLONAZEPAM 0.5 MG PO TABS: ORAL_TABLET | ORAL | Status: AC

## 2015-12-27 MED ORDER — CLONAZEPAM 0.5 MG PO TABS: ORAL_TABLET | ORAL | Status: DC

## 2015-12-27 NOTE — Progress Notes (Signed)
Pre visit review using our clinic review tool, if applicable. No additional management support is needed unless otherwise documented below in the visit note. 

## 2015-12-27 NOTE — Progress Notes (Signed)
OFFICE VISIT  12/27/2015   CC:  Chief Complaint  Patient presents with  . Panic Attack    new x 2 weeks, shaky and sweating   HPI:    Patient is a 45 y.o. Caucasian male who presents for about 10d hx of not wanting to leave his house. Gets shaky, nervous, sweaty, panicky.  Has felt "off" since June this year: anhedonic, crying spells. Has had depression before when a family member had dx of cancer: was put on venlafaxine and xanax per his report. Going through anniversary of brother's death in MVA.  Denies SI.  He has called into work every day b/c he has panic attack every time he tries to go. No substances used to self treat.  Has been taking his vicodin like he usually takes it: says last dose was 3-4 days ago. Physically, he is ready to go back to work--back is much improved, but psychologically he is not able to go. Has never had a psychiatrist.  Methocarbamol and another muscle relaxer were rx'd by another MD recently when he hurt his back.  He says he has not taken either of them in a week or so.   Past Medical History  Diagnosis Date  . Mild intermittent asthma   . Urinary frequency   . Allergic rhinitis     pollen (+other?)  . Chronic allergic conjunctivitis   . Chronic pain syndrome     R ankle, intermittent pain since surgery 2007  . Chronic pain     PT reports he takes hydrocodone for chronic pain to RT ankle.  . Tobacco abuse     Quit 2014 (44 pack-yr hx)  . IFG (impaired fasting glucose) 09/2015    Fasting gluc 107, A1c 5.9%    Past Surgical History  Procedure Laterality Date  . Hypospadias correction    . Wisdom tooth extraction    . Ankle fracture surgery      Right ankle surgery (pins in) 2007    Outpatient Prescriptions Prior to Visit  Medication Sig Dispense Refill  . Hydrocodone-Acetaminophen 7.5-300 MG TABS 1-2 tabs q6h prn right ankle pain 30 each 0  . Multiple Vitamin (MULTIVITAMIN) tablet Take 1 tablet by mouth daily.    . naproxen (NAPROSYN)  500 MG tablet Take 1 tablet (500 mg total) by mouth 2 (two) times daily. 30 tablet 0  . Omega-3 Fatty Acids (FISH OIL) 1000 MG CAPS Take by mouth.     No facility-administered medications prior to visit.    Allergies  Allergen Reactions  . Anectine [Succinylcholine Chloride]     Stops heart.  . Codeine     Insomnia     ROS As per HPI  PE: Blood pressure 151/106, pulse 78, temperature 97.5 F (36.4 C), temperature source Oral, resp. rate 16, height 5' 10.25" (1.784 m), weight 196 lb 12 oz (89.245 kg), SpO2 99 %. Wt Readings from Last 2 Encounters:  12/27/15 196 lb 12 oz (89.245 kg)  12/13/15 195 lb 8 oz (88.678 kg)    Gen: alert, oriented x 4, affect is sad--crying much of the interview.  Lucid thinking and conversation noted. HEENT: PERRLA, EOMI.   Neck: no LAD, mass, or thyromegaly. CV: RRR, no m/r/g LUNGS: CTA bilat, nonlabored. NEURO: no tremor or tics noted on observation.  Coordination intact. CN 2-12 grossly intact bilaterally, strength 5/5 in all extremeties.  No ataxia.   LABS:  Lab Results  Component Value Date   TSH 1.58 09/27/2015   Lab Results  Component Value Date   WBC 7.2 09/27/2015   HGB 14.8 09/27/2015   HCT 43.9 09/27/2015   MCV 85.0 09/27/2015   PLT 330.0 09/27/2015   Lab Results  Component Value Date   CREATININE 0.91 09/27/2015   BUN 10 09/27/2015   NA 138 09/27/2015   K 4.3 09/27/2015   CL 101 09/27/2015   CO2 29 09/27/2015   Lab Results  Component Value Date   ALT 17 09/27/2015   AST 13 09/27/2015   ALKPHOS 81 09/27/2015   BILITOT 0.4 09/27/2015   Lab Results  Component Value Date   CHOL 216* 09/27/2015   Lab Results  Component Value Date   HDL 46.60 09/27/2015   Lab Results  Component Value Date   LDLCALC 139* 09/27/2015   Lab Results  Component Value Date   TRIG 148.0 09/27/2015   Lab Results  Component Value Date   CHOLHDL 5 09/27/2015    IMPRESSION AND PLAN:  Panic disorder, agoraphobia, 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 Start paxil 20mg  qd.  Therapeutic expectations and side effect profile of medication discussed today.  Patient's questions answered. Start clonazepam 0.5mg , 1-2 tabs bid prn, #20, RF x 1. Urine toxicology screen obtained today. Work note excusing him for all this week.  An After Visit Summary was printed and given to the patient.  FOLLOW UP: Return in about 3 weeks (around 01/17/2016) for routine chronic illness f/u.   Signed:  Santiago BumpersPhil McGowen, MD           12/27/2015

## 2016-01-06 ENCOUNTER — Other Ambulatory Visit: Payer: Self-pay | Admitting: *Deleted

## 2016-01-06 NOTE — Telephone Encounter (Signed)
Pt LMOM on 01/06/16 at 8:43am requesting refill.   RF request for hydro/apap LOV: 12/27/15 Next ov: 01/17/16 Last written: 12/13/15 #30 w/ [id number]  Please advise. Thanks.

## 2016-01-07 MED ORDER — HYDROCODONE-ACETAMINOPHEN 7.5-300 MG PO TABS: ORAL_TABLET | ORAL | Status: AC

## 2016-01-07 NOTE — Telephone Encounter (Signed)
Rx put up front for p/u. Left detailed message advising pt.

## 2016-01-09 ENCOUNTER — Telehealth: Payer: Self-pay | Admitting: Family Medicine

## 2016-01-09 NOTE — Telephone Encounter (Signed)
Left message for pt to call back  °

## 2016-01-09 NOTE — Telephone Encounter (Signed)
Pt dropped off an FMLA form. Pls call and ask him what dates this is for (is this for the most recent week of work he missed due to anxiety?.  Prior to this he was out for about 10d due to low back pain).   Let me know-thx

## 2016-01-10 NOTE — Telephone Encounter (Signed)
Spoke to pt he stated that the form is for the 3 weeks he missed due to anxiety.

## 2016-01-12 NOTE — Telephone Encounter (Signed)
Tell pt that according to my documentation, he missed work due to his back pain from 6/19 to 7/2. Then he missed work due to his anxiety 7/3-7/7.  Pls ask him which of these time periods he wants me to put on the paperwork--it cannot be BOTH time periods.-thx

## 2016-01-13 NOTE — Telephone Encounter (Signed)
Left message for pt to call back  °

## 2016-01-16 ENCOUNTER — Encounter: Payer: Self-pay | Admitting: *Deleted

## 2016-01-16 ENCOUNTER — Telehealth: Payer: Self-pay | Admitting: Family Medicine

## 2016-01-16 ENCOUNTER — Encounter: Payer: Self-pay | Admitting: Family Medicine

## 2016-01-16 ENCOUNTER — Ambulatory Visit (INDEPENDENT_AMBULATORY_CARE_PROVIDER_SITE_OTHER): Payer: Managed Care, Other (non HMO) | Admitting: Family Medicine

## 2016-01-16 VITALS — BP 113/83 | HR 62 | Temp 97.7°F | Resp 16 | Ht 70.25 in | Wt 193.5 lb

## 2016-01-16 DIAGNOSIS — F4001 Agoraphobia with panic disorder: Secondary | ICD-10-CM | POA: Diagnosis not present

## 2016-01-16 DIAGNOSIS — G8929 Other chronic pain: Secondary | ICD-10-CM

## 2016-01-16 DIAGNOSIS — M25579 Pain in unspecified ankle and joints of unspecified foot: Secondary | ICD-10-CM | POA: Diagnosis not present

## 2016-01-16 DIAGNOSIS — T50905A Adverse effect of unspecified drugs, medicaments and biological substances, initial encounter: Secondary | ICD-10-CM

## 2016-01-16 DIAGNOSIS — T887XXA Unspecified adverse effect of drug or medicament, initial encounter: Secondary | ICD-10-CM | POA: Diagnosis not present

## 2016-01-16 MED ORDER — PAROXETINE HCL 10 MG PO TABS: 10.0000 mg | ORAL_TABLET | Freq: Every day | ORAL | 1 refills | Status: AC

## 2016-01-16 NOTE — Telephone Encounter (Signed)
Patient stated today that he was "done with Dr Milinda Cave"  Basically because Dr. Milinda Cave has been trying to get him into see a pain management doctor and patient has been avoiding going due to cost but he continues to come to this office and request letters to be out of work and pain management medication.    We did get the pain management on the phone with the patient and we scheduled appointment with [facility].    Should we take Dr. Milinda Cave off as his PCP?  Please let me know.  Thank you!

## 2016-01-16 NOTE — Telephone Encounter (Signed)
Noted  

## 2016-01-16 NOTE — Progress Notes (Signed)
Pre visit review using our clinic review tool, if applicable. No additional management support is needed unless otherwise documented below in the visit note. 

## 2016-01-16 NOTE — Telephone Encounter (Signed)
Spoke to pt. He stated to use the dates he was out due to back pain 12/09/15-12/22/15.

## 2016-01-16 NOTE — Progress Notes (Signed)
OFFICE VISIT  01/16/2016   CC:  Chief Complaint  Patient presents with  . Follow-up   HPI:    Patient is a 45 y.o. Caucasian male who presents for f/u chronic R ankle pain since ankle fracture surgery in 2007.  He has recently had some troubles with low back strain, which I saw him for in the office, as well as some anxiety/panic, which I also saw him for in the office.  I referred him to pain management clinic 12/13/15 and we checked on the status of this with [facility] and they said they have left 2 separate messages on his answering machine but he has not called back to schedule appt.  Pt states that there is likely an incorrect phone number issue.  I did a urine tox screen last visit and it returned with appropriate results.  Also, on 12/27/15 I started him on paxil  qd and clonazepam 0.21m, 1-2 bid prn for panic disorder and agoraphobia, for which he had also missed quite a bit of work due to inability to leave his home due to the anxiety/panic.  He has only a couple mild panic attacks since I saw him last.  These are mainly just a really nervous feeling, not panic. No depression.  Says he is tired, says all he wants to do is sleep.  Says he feels like it is probably due to the paxil. He is going to have to reserve the clonaz to night time use only due to sedation.   Past Medical History:  Diagnosis Date  . Allergic rhinitis    pollen (+other?)  . Chronic allergic conjunctivitis   . Chronic pain    PT reports he takes hydrocodone for chronic pain to RT ankle.  . Chronic pain syndrome    R ankle, intermittent pain since surgery 2007  . IFG (impaired fasting glucose) 09/2015   Fasting gluc 107, A1c 5.9%  . Mild intermittent asthma   . Tobacco abuse    Quit 2014 (44 pack-yr hx)  . Urinary frequency     Past Surgical History:  Procedure Laterality Date  . ANKLE FRACTURE SURGERY     Right ankle surgery (pins in) 2007  . HYPOSPADIAS CORRECTION    . WISDOM TOOTH  EXTRACTION      Outpatient Medications Prior to Visit  Medication Sig Dispense Refill  . clonazePAM (KLONOPIN) 0.5 MG tablet 1-2 tabs po bid prn severe anxiety 20 tablet 1  . Hydrocodone-Acetaminophen 7.5-300 MG TABS 1-2 tabs q6h prn right ankle pain 30 each 0  . Multiple Vitamin (MULTIVITAMIN) tablet Take 1 tablet by mouth daily.    . naproxen (NAPROSYN) 500 MG tablet Take 1 tablet (500 mg total) by mouth 2 (two) times daily. 30 tablet 0  . Omega-3 Fatty Acids (FISH OIL) 1000 MG CAPS Take by mouth.    Marland Kitchen PARoxetine (PAXIL) 20 MG tablet Take 1 tablet (20 mg total) by mouth daily. 30 tablet 0   No facility-administered medications prior to visit.     Allergies  Allergen Reactions  . Anectine [Succinylcholine Chloride]     Stops heart.  . Codeine     Insomnia     ROS As per HPI  PE: Blood pressure 113/83, pulse 62, temperature 97.7 F (36.5 C), temperature source Oral, resp. rate 16, height 5' 10.25" (1.784 m), weight 193 lb 8 oz (87.8 kg), SpO2 99 %. Gen: Alert, well appearing.  Patient is oriented to person, place, time, and situation. AFFECT: pleasant,  lucid thought and speech. No further exam today.  LABS:  none  IMPRESSION AND PLAN:  1) Anxiety with panic attacks and agoraphobia: improved. Responding to meds but I'll drop dose of paxil to 10mg  qd and I instructed him to take his clonazepam only at night due to sedative side effects.  I told him he must push through his tiredness and start going to work.  He asks for another note excusing him from work 7/21-7/28, 2017.  2) Chronic right ankle pain: we got pain mgmt clinic on phone while pt was here today and got him an appt.  An After Visit Summary was printed and given to the patient.  FOLLOW UP: Return in about 4 weeks (around 02/13/2016) for routine chronic illness f/u.  Signed:  Santiago Bumpers, MD           01/16/2016

## 2016-01-17 ENCOUNTER — Ambulatory Visit: Payer: Managed Care, Other (non HMO) | Admitting: Family Medicine

## 2016-01-17 NOTE — Telephone Encounter (Signed)
We need to leave Dr Milinda Cave as the PCP unless he is dismissing the patient.

## 2016-01-21 ENCOUNTER — Encounter: Payer: Self-pay | Admitting: Family Medicine

## 2016-05-03 IMAGING — CR DG FOOT COMPLETE 3+V*L*
3 series · 3 of 3 positions shown · non-contrast
Comparison: None.

CLINICAL DATA: Pt reports he got his left foot caught in a blanket
1 week ago while he was trying to get up and has been having pain,
swelling, and bruising on the lateral side of left foot and ankle
since then; he has been unable to bear weight *

EXAM:
LEFT FOOT - COMPLETE 3+ VIEW

[foot ap]
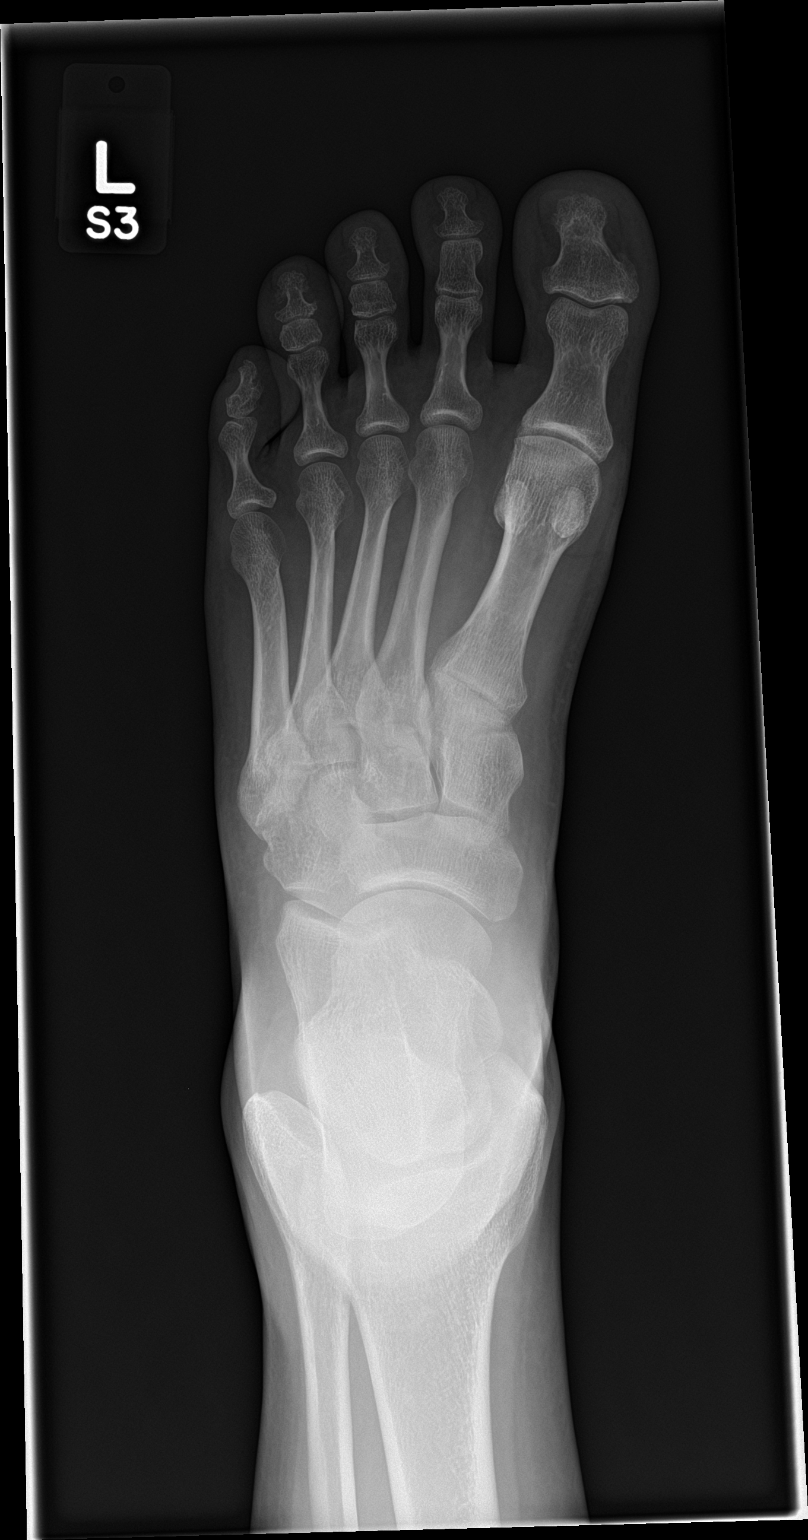

[foot obl]
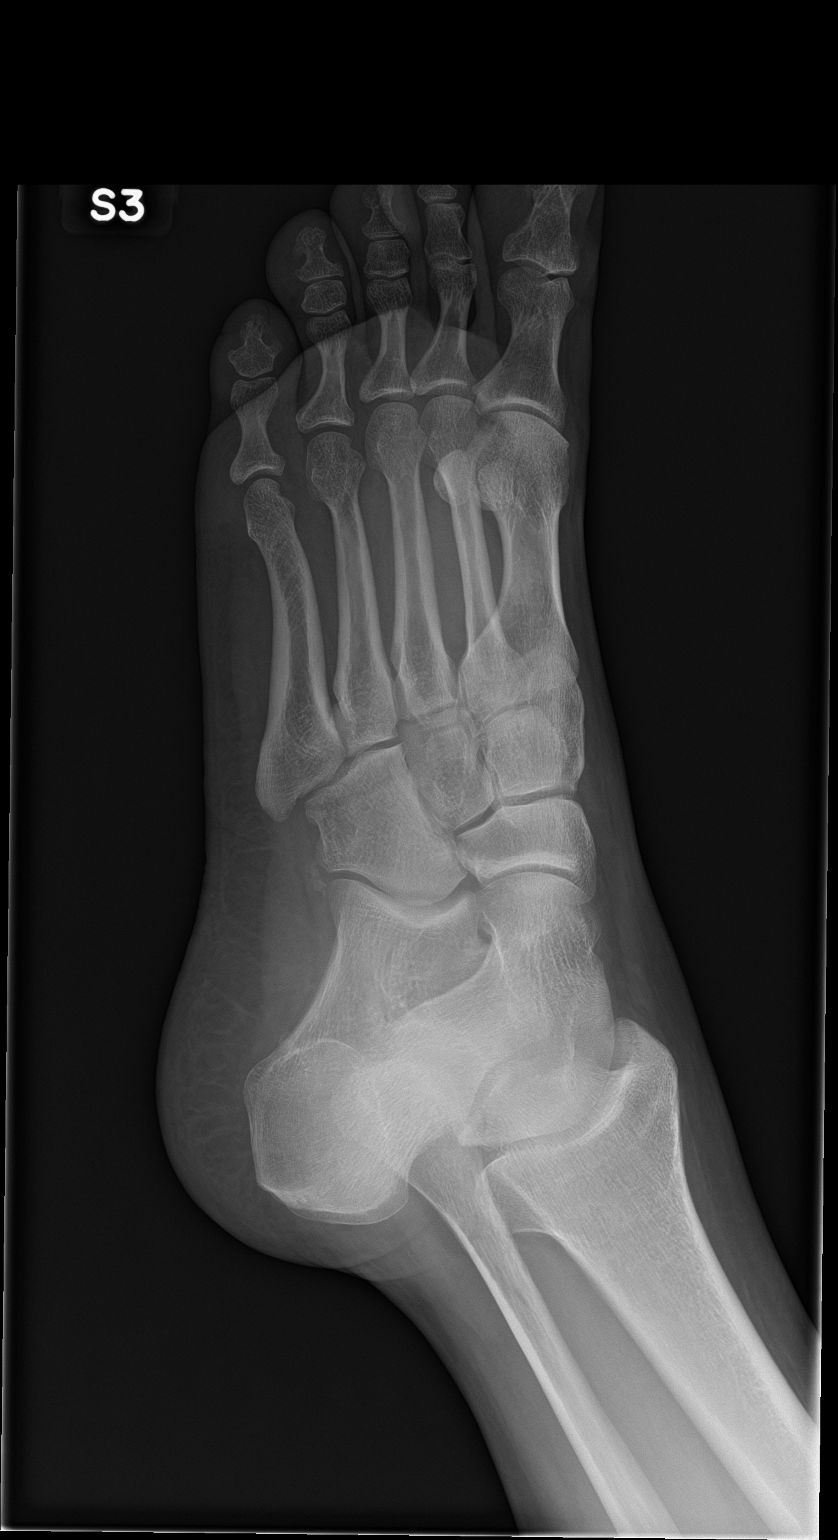

[foot lat]
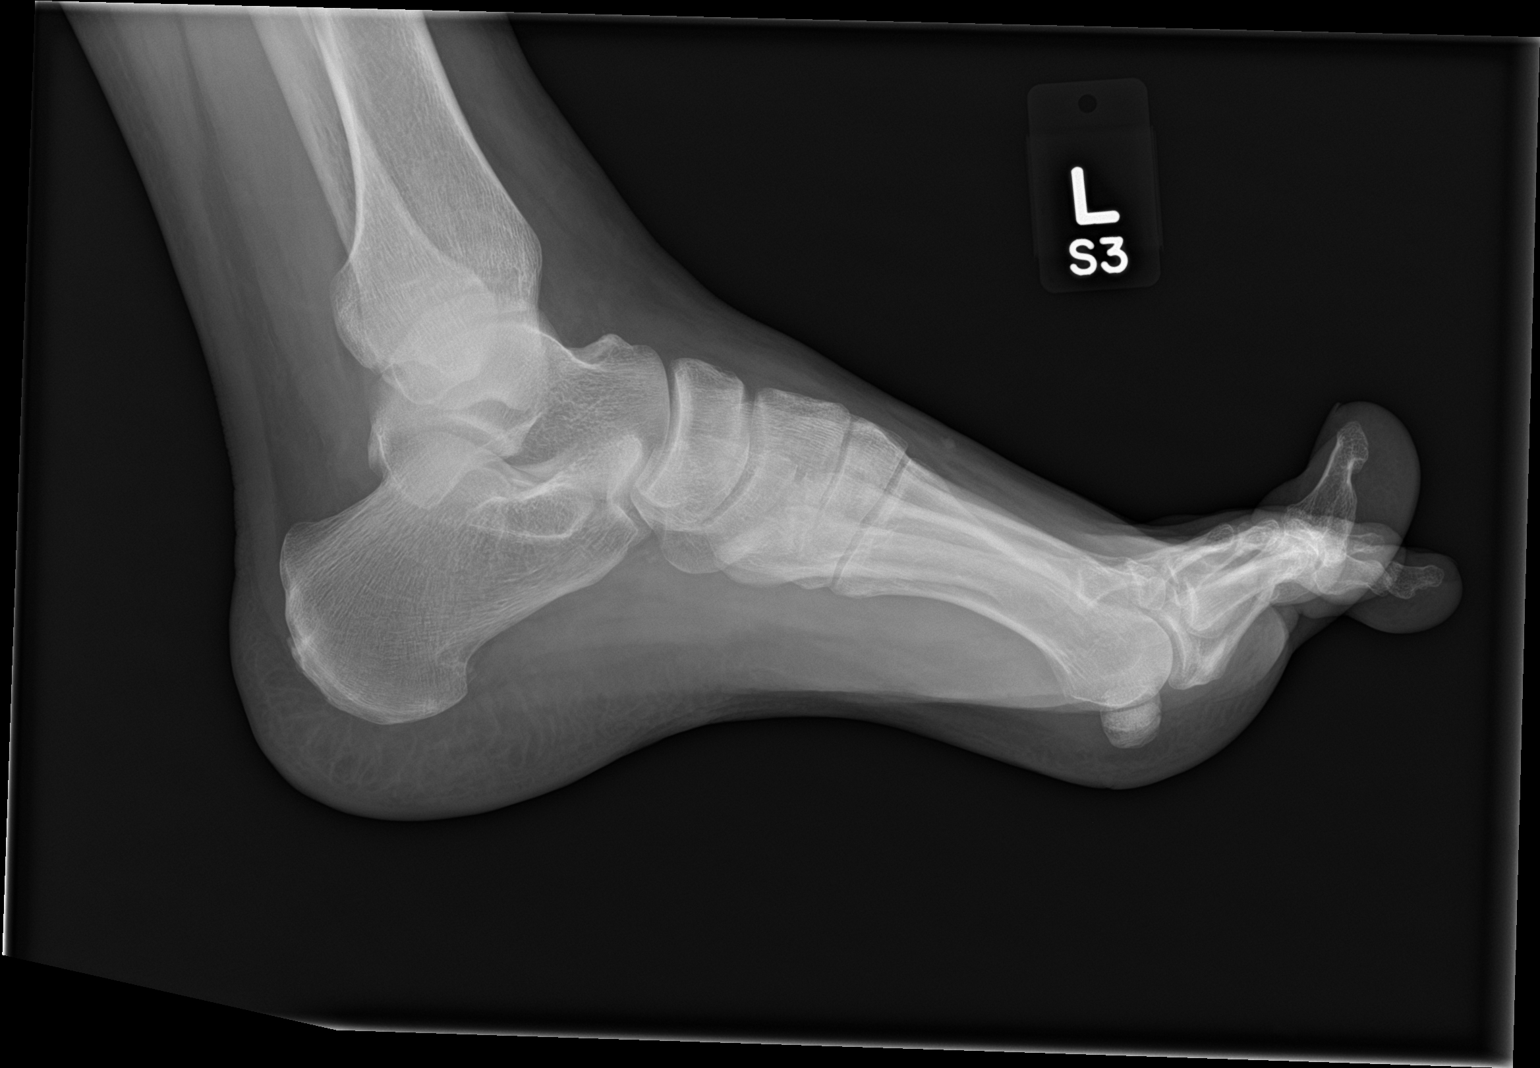

[3 of 3 positions shown; findings below may reference images not displayed]

FINDINGS: No fracture or dislocation of mid foot or forefoot. The phalanges
are normal. The calcaneus is normal. No soft tissue abnormality.
IMPRESSION: No acute osseous abnormality.
